# Patient Record
Sex: Female | Born: 2000 | Race: Black or African American | Hispanic: No | Marital: Single | Smoking: Never smoker
Health system: Southern US, Community
[De-identification: ages and names within clinical notes are randomized; demographics above are authoritative.]

## PROBLEM LIST (undated history)

## (undated) DIAGNOSIS — G43909 Migraine, unspecified, not intractable, without status migrainosus: Secondary | ICD-10-CM

## (undated) DIAGNOSIS — E282 Polycystic ovarian syndrome: Secondary | ICD-10-CM

## (undated) HISTORY — DX: Polycystic ovarian syndrome: E28.2

## (undated) HISTORY — DX: Migraine, unspecified, not intractable, without status migrainosus: G43.909

---
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
## 2019-04-30 DIAGNOSIS — Z114 Encounter for screening for human immunodeficiency virus [HIV]: Secondary | ICD-10-CM | POA: Diagnosis not present

## 2019-04-30 DIAGNOSIS — Z01419 Encounter for gynecological examination (general) (routine) without abnormal findings: Secondary | ICD-10-CM | POA: Diagnosis not present

## 2019-04-30 DIAGNOSIS — Z113 Encounter for screening for infections with a predominantly sexual mode of transmission: Secondary | ICD-10-CM | POA: Diagnosis not present

## 2019-04-30 DIAGNOSIS — Z118 Encounter for screening for other infectious and parasitic diseases: Secondary | ICD-10-CM | POA: Diagnosis not present

## 2019-04-30 DIAGNOSIS — N914 Secondary oligomenorrhea: Secondary | ICD-10-CM | POA: Diagnosis not present

## 2019-04-30 DIAGNOSIS — Z1159 Encounter for screening for other viral diseases: Secondary | ICD-10-CM | POA: Diagnosis not present

## 2019-04-30 DIAGNOSIS — Z6838 Body mass index (BMI) 38.0-38.9, adult: Secondary | ICD-10-CM | POA: Diagnosis not present

## 2019-07-31 ENCOUNTER — Other Ambulatory Visit: Payer: Self-pay

## 2019-07-31 DIAGNOSIS — Z20828 Contact with and (suspected) exposure to other viral communicable diseases: Secondary | ICD-10-CM | POA: Diagnosis not present

## 2019-07-31 DIAGNOSIS — Z20822 Contact with and (suspected) exposure to covid-19: Secondary | ICD-10-CM

## 2019-08-02 LAB — NOVEL CORONAVIRUS, NAA: SARS-CoV-2, NAA: NOT DETECTED

## 2019-08-24 DIAGNOSIS — N914 Secondary oligomenorrhea: Secondary | ICD-10-CM | POA: Diagnosis not present

## 2019-09-18 ENCOUNTER — Other Ambulatory Visit: Payer: Self-pay

## 2019-09-18 DIAGNOSIS — Z20822 Contact with and (suspected) exposure to covid-19: Secondary | ICD-10-CM

## 2019-09-20 LAB — NOVEL CORONAVIRUS, NAA: SARS-CoV-2, NAA: NOT DETECTED

## 2019-10-28 ENCOUNTER — Ambulatory Visit: Payer: BC Managed Care – PPO | Attending: Internal Medicine

## 2019-10-28 DIAGNOSIS — Z20822 Contact with and (suspected) exposure to covid-19: Secondary | ICD-10-CM

## 2019-10-29 LAB — NOVEL CORONAVIRUS, NAA: SARS-CoV-2, NAA: NOT DETECTED

## 2019-12-22 DIAGNOSIS — Z1159 Encounter for screening for other viral diseases: Secondary | ICD-10-CM | POA: Diagnosis not present

## 2019-12-22 DIAGNOSIS — Z118 Encounter for screening for other infectious and parasitic diseases: Secondary | ICD-10-CM | POA: Diagnosis not present

## 2019-12-22 DIAGNOSIS — Z114 Encounter for screening for human immunodeficiency virus [HIV]: Secondary | ICD-10-CM | POA: Diagnosis not present

## 2019-12-22 DIAGNOSIS — Z113 Encounter for screening for infections with a predominantly sexual mode of transmission: Secondary | ICD-10-CM | POA: Diagnosis not present

## 2019-12-22 DIAGNOSIS — B373 Candidiasis of vulva and vagina: Secondary | ICD-10-CM | POA: Diagnosis not present

## 2020-01-25 DIAGNOSIS — Z113 Encounter for screening for infections with a predominantly sexual mode of transmission: Secondary | ICD-10-CM | POA: Diagnosis not present

## 2020-01-25 DIAGNOSIS — Z1159 Encounter for screening for other viral diseases: Secondary | ICD-10-CM | POA: Diagnosis not present

## 2020-01-25 DIAGNOSIS — Z118 Encounter for screening for other infectious and parasitic diseases: Secondary | ICD-10-CM | POA: Diagnosis not present

## 2020-01-25 DIAGNOSIS — N760 Acute vaginitis: Secondary | ICD-10-CM | POA: Diagnosis not present

## 2020-01-25 DIAGNOSIS — Z114 Encounter for screening for human immunodeficiency virus [HIV]: Secondary | ICD-10-CM | POA: Diagnosis not present

## 2020-01-25 DIAGNOSIS — L292 Pruritus vulvae: Secondary | ICD-10-CM | POA: Diagnosis not present

## 2020-03-01 ENCOUNTER — Other Ambulatory Visit: Payer: Self-pay

## 2020-03-01 ENCOUNTER — Ambulatory Visit: Payer: BC Managed Care – PPO | Attending: Internal Medicine

## 2020-03-01 DIAGNOSIS — Z20822 Contact with and (suspected) exposure to covid-19: Secondary | ICD-10-CM

## 2020-03-02 LAB — NOVEL CORONAVIRUS, NAA: SARS-CoV-2, NAA: NOT DETECTED

## 2020-03-02 LAB — SARS-COV-2, NAA 2 DAY TAT

## 2020-05-24 DIAGNOSIS — Z113 Encounter for screening for infections with a predominantly sexual mode of transmission: Secondary | ICD-10-CM | POA: Diagnosis not present

## 2020-05-24 DIAGNOSIS — Z01419 Encounter for gynecological examination (general) (routine) without abnormal findings: Secondary | ICD-10-CM | POA: Diagnosis not present

## 2020-05-24 DIAGNOSIS — Z6838 Body mass index (BMI) 38.0-38.9, adult: Secondary | ICD-10-CM | POA: Diagnosis not present

## 2020-10-03 DIAGNOSIS — F4323 Adjustment disorder with mixed anxiety and depressed mood: Secondary | ICD-10-CM | POA: Diagnosis not present

## 2020-10-05 DIAGNOSIS — Z113 Encounter for screening for infections with a predominantly sexual mode of transmission: Secondary | ICD-10-CM | POA: Diagnosis not present

## 2020-10-05 DIAGNOSIS — Z01419 Encounter for gynecological examination (general) (routine) without abnormal findings: Secondary | ICD-10-CM | POA: Diagnosis not present

## 2020-10-05 DIAGNOSIS — E282 Polycystic ovarian syndrome: Secondary | ICD-10-CM | POA: Diagnosis not present

## 2020-10-12 DIAGNOSIS — U071 COVID-19: Secondary | ICD-10-CM | POA: Diagnosis not present

## 2020-10-13 DIAGNOSIS — F4323 Adjustment disorder with mixed anxiety and depressed mood: Secondary | ICD-10-CM | POA: Diagnosis not present

## 2021-05-03 ENCOUNTER — Other Ambulatory Visit: Payer: BC Managed Care – PPO

## 2021-05-04 ENCOUNTER — Ambulatory Visit: Payer: BC Managed Care – PPO | Attending: Internal Medicine

## 2021-05-04 DIAGNOSIS — Z20822 Contact with and (suspected) exposure to covid-19: Secondary | ICD-10-CM

## 2021-05-05 LAB — NOVEL CORONAVIRUS, NAA: SARS-CoV-2, NAA: NOT DETECTED

## 2021-05-05 LAB — SARS-COV-2, NAA 2 DAY TAT

## 2021-05-16 ENCOUNTER — Encounter (HOSPITAL_COMMUNITY): Payer: Self-pay | Admitting: Emergency Medicine

## 2021-05-16 ENCOUNTER — Ambulatory Visit (HOSPITAL_COMMUNITY)
Admission: EM | Admit: 2021-05-16 | Discharge: 2021-05-16 | Disposition: A | Discharge status: Home / Self Care | Payer: 59 | Attending: Emergency Medicine | Admitting: Emergency Medicine

## 2021-05-16 DIAGNOSIS — R0789 Other chest pain: Secondary | ICD-10-CM

## 2021-05-16 DIAGNOSIS — K219 Gastro-esophageal reflux disease without esophagitis: Secondary | ICD-10-CM

## 2021-05-16 DIAGNOSIS — F419 Anxiety disorder, unspecified: Secondary | ICD-10-CM

## 2021-05-16 MED ORDER — PANTOPRAZOLE SODIUM 20 MG PO TBEC: 20.0000 mg | DELAYED_RELEASE_TABLET | Freq: Every day | ORAL | 0 refills | Status: DC

## 2021-05-16 NOTE — Discharge Instructions (Addendum)
Take the Protonix daily to help with acid reflux and chest pain.  Make sure you are drinking plenty of fluids especially water.  Try to avoid spicy or high acidity foods.  Do not lay down immediately after eating.  If you feel stressed or anxious, try relaxation or deep breathing exercises.    Follow up with your primary care provider as scheduled.   Return or go to the Emergency Department if symptoms worsen or do not improve in the next few days.

## 2021-05-16 NOTE — ED Provider Notes (Signed)
[facility]    CSN: [id number] Arrival date & time: 05/16/21  1026      History   Chief Complaint Chief Complaint  Patient presents with   Chest Pain   Headache   Back Pain    HPI Andrea Scott is a 20 y.o. female.   Patient here for evaluation of chest pain/pressure, upper back pain, and intermittent arm pain that has been ongoing for the past 1-2 weeks.  Reports chest pain is worse at night when laying down.  Denies any abdominal pain, nausea, vomiting, or shortness of breath.  Has not tried any OTC medication or treatment.  Does report eating late at night.  Denies any trauma, injury, or other precipitating event.  Denies any specific alleviating or aggravating factors.  Denies any fevers, shortness of breath, numbness, tingling, weakness, abdominal pain, or headaches.    The history is provided by the patient.  Chest Pain Associated symptoms: back pain and headache   Associated symptoms: no shortness of breath   Headache Associated symptoms: back pain   Back Pain Associated symptoms: chest pain and headaches    History reviewed. No pertinent past medical history.  Patient Active Problem List   Diagnosis Date Noted   Non-obstetric vaginal laceration without foreign body or perineal laceration     Past Surgical History:  Procedure Laterality Date   BREAST REDUCTION SURGERY  2019   PERINEAL LACERATION REPAIR N/A 03/02/2018   Procedure: SUTURE REPAIR VAGINAL LACERATION;  Surgeon: Lazaro Arms, MD;  Location: [facility];  Service: Gynecology;  Laterality: N/A;  repair of vaginal laceration   VULVECTOMY PARTIAL Bilateral 10/05/2015   Procedure: VULVECTOMY PARTIAL;  Surgeon: Maxie Better, MD;  Location: [facility];  Service: Gynecology;  Laterality: Bilateral;  45 minutes    OB History   No obstetric history on file.      Home Medications    Prior to Admission medications   Medication Sig Start Date End Date Taking? Authorizing Provider  pantoprazole  (PROTONIX) 20 MG tablet Take 1 tablet (20 mg total) by mouth daily. 05/16/21  Yes Ivette Loyal, NP  cetirizine (ZYRTEC) 5 MG tablet Take 5 mg by mouth daily.    [provider]  HYDROcodone-acetaminophen (NORCO/VICODIN) 5-325 MG tablet Take 1 tablet by mouth every 6 (six) hours as needed. 03/02/18   Lazaro Arms, MD  ibuprofen (ADVIL,MOTRIN) 800 MG tablet Take 1 tablet (800 mg total) by mouth every 8 (eight) hours as needed. 03/02/18   Lazaro Arms, MD  OVER THE COUNTER MEDICATION Take 1 tablet by mouth daily. Patient takes over the counter Vitamin D    [provider]    Family History History reviewed. No pertinent family history.  Social History Social History   Tobacco Use   Smoking status: Never   Smokeless tobacco: Never  Substance Use Topics   Alcohol use: No   Drug use: No     Allergies   Patient has no known allergies.   Review of Systems Review of Systems  Respiratory:  Negative for shortness of breath.   Cardiovascular:  Positive for chest pain.  Musculoskeletal:  Positive for back pain.  Neurological:  Positive for headaches.  All other systems reviewed and are negative.   Physical Exam Triage Vital Signs ED Triage Vitals  Enc Vitals Group     BP 05/16/21 1126 131/80     Pulse Rate 05/16/21 1126 86     Resp 05/16/21 1126 18  Temp 05/16/21 1126 99 F (37.2 C)     Temp Source 05/16/21 1126 Oral     SpO2 05/16/21 1126 99 %     Weight --      Height --      Head Circumference --      Peak Flow --      Pain Score 05/16/21 1124 5     Pain Loc --      Pain Edu? --      Excl. in GC? --    No data found.  Updated Vital Signs BP 131/80   Pulse 86   Temp 99 F (37.2 C) (Oral)   Resp 18   SpO2 99%   Visual Acuity Right Eye Distance:   Left Eye Distance:   Bilateral Distance:    Right Eye Near:   Left Eye Near:    Bilateral Near:     Physical Exam Vitals and nursing note reviewed.  Constitutional:      General: She is  not in acute distress.    Appearance: Normal appearance. She is not ill-appearing, toxic-appearing or diaphoretic.  HENT:     Head: Normocephalic and atraumatic.  Eyes:     Extraocular Movements: Extraocular movements intact.     Conjunctiva/sclera: Conjunctivae normal.     Pupils: Pupils are equal, round, and reactive to light.  Cardiovascular:     Rate and Rhythm: Normal rate and regular rhythm.     Pulses: Normal pulses.          Radial pulses are 2+ on the right side and 2+ on the left side.     Heart sounds: Normal heart sounds.  Pulmonary:     Effort: Pulmonary effort is normal.     Breath sounds: Normal breath sounds.  Chest:     Chest wall: No mass, deformity, tenderness, crepitus or edema. There is no dullness to percussion.  Abdominal:     General: Abdomen is flat.     Palpations: Abdomen is soft.  Musculoskeletal:        General: Normal range of motion.     Cervical back: Normal and normal range of motion. No tenderness or bony tenderness.     Thoracic back: Normal. No tenderness or bony tenderness.     Lumbar back: Normal.  Skin:    General: Skin is warm and dry.  Neurological:     General: No focal deficit present.     Mental Status: She is alert and oriented to person, place, and time.  Psychiatric:        Mood and Affect: Mood normal.     UC Treatments / Results  Labs (all labs ordered are listed, but only abnormal results are displayed) Labs Reviewed - No data to display  EKG   Radiology No results found.  Procedures Procedures (including critical care time)  Medications Ordered in UC Medications - No data to display  Initial Impression / Assessment and Plan / UC Course  I have reviewed the triage vital signs and the nursing notes.  Pertinent labs & imaging results that were available during my care of the patient were reviewed by me and considered in my medical decision making (see chart for details).    Assessment negative for red flags or  concerns.  Chest wall pain.  Possible GERD vs anxiety.  Patient does reports history of GERD and mentions being under more stress than normal.  EKG normal sinus rhythm with normal rate.  Will treat with protonix  daily.  Encouraged fluids and avoid spicy or high acidity foods.  Recommend relaxation or deep breathing exercises for anxiety.  Follow up with primary care as scheduled.  Strict ED follow up for any red flag symptoms.   Final Clinical Impressions(s) / UC Diagnoses   Final diagnoses:  Chest wall pain  Gastroesophageal reflux disease without esophagitis  Anxiety     Discharge Instructions      Take the Protonix daily to help with acid reflux and chest pain.  Make sure you are drinking plenty of fluids especially water.  Try to avoid spicy or high acidity foods.  Do not lay down immediately after eating.  If you feel stressed or anxious, try relaxation or deep breathing exercises.    Follow up with your primary care provider as scheduled.   Return or go to the Emergency Department if symptoms worsen or do not improve in the next few days.      ED Prescriptions     Medication Sig Dispense Auth. Provider   pantoprazole (PROTONIX) 20 MG tablet Take 1 tablet (20 mg total) by mouth daily. 30 tablet Ivette Loyal, NP      PDMP not reviewed this encounter.   Ivette Loyal, NP 05/16/21 1300

## 2021-05-16 NOTE — ED Triage Notes (Signed)
Pt is present today with left arm pain, HA, left sided chest pressure, and upper back pain. Pt states that her sx started x1 week ago.

## 2021-07-16 ENCOUNTER — Encounter (HOSPITAL_COMMUNITY): Payer: Self-pay | Admitting: Emergency Medicine

## 2021-07-16 ENCOUNTER — Other Ambulatory Visit: Payer: Self-pay

## 2021-07-16 ENCOUNTER — Ambulatory Visit (HOSPITAL_COMMUNITY)
Admission: EM | Admit: 2021-07-16 | Discharge: 2021-07-16 | Disposition: A | Discharge status: Home / Self Care | Payer: 59 | Attending: Physician Assistant | Admitting: Physician Assistant

## 2021-07-16 DIAGNOSIS — R8290 Unspecified abnormal findings in urine: Secondary | ICD-10-CM | POA: Diagnosis present

## 2021-07-16 DIAGNOSIS — N898 Other specified noninflammatory disorders of vagina: Secondary | ICD-10-CM | POA: Diagnosis present

## 2021-07-16 LAB — POCT URINALYSIS DIPSTICK, ED / UC
Bilirubin Urine: NEGATIVE
Glucose, UA: NEGATIVE mg/dL
Hgb urine dipstick: NEGATIVE
Ketones, ur: NEGATIVE mg/dL
Leukocytes,Ua: NEGATIVE
Nitrite: NEGATIVE
Protein, ur: NEGATIVE mg/dL
Specific Gravity, Urine: 1.025 (ref 1.005–1.030)
Urobilinogen, UA: 0.2 mg/dL (ref 0.0–1.0)
pH: 6 (ref 5.0–8.0)

## 2021-07-16 NOTE — Discharge Instructions (Addendum)
Check MyChart for results. We will call and treat any positive test. Follow up with any further concerns.

## 2021-07-16 NOTE — ED Provider Notes (Signed)
[facility]    CSN: [id number] Arrival date & time: 07/16/21  1509      History   Chief Complaint Chief Complaint  Patient presents with   Vaginal Itching   Vaginal Discharge    HPI Andrea Scott is a 20 y.o. female.   Patient here today for evaluation of foul-smelling urine, and vaginal discharge that she has noted for the last 5 days.  She reports that she has not had any dysuria, and denies any urinary frequency.  She denies risk of pregnancy or STDs as she was checked for this in August and has not been sexually active since that time.  She denies any abdominal pain, nausea, vomiting.  She has not had any back pain that is unusual for her.  She does not report any treatment for symptoms.  The history is provided by the patient.  Vaginal Itching Pertinent negatives include no abdominal pain and no shortness of breath.  Vaginal Discharge Associated symptoms: vaginal itching   Associated symptoms: no abdominal pain, no dysuria, no fever, no nausea and no vomiting    History reviewed. No pertinent past medical history.  Patient Active Problem List   Diagnosis Date Noted   Non-obstetric vaginal laceration without foreign body or perineal laceration     Past Surgical History:  Procedure Laterality Date   BREAST REDUCTION SURGERY  2019   PERINEAL LACERATION REPAIR N/A 03/02/2018   Procedure: SUTURE REPAIR VAGINAL LACERATION;  Surgeon: Lazaro Arms, MD;  Location: [facility];  Service: Gynecology;  Laterality: N/A;  repair of vaginal laceration   VULVECTOMY PARTIAL Bilateral 10/05/2015   Procedure: VULVECTOMY PARTIAL;  Surgeon: Maxie Better, MD;  Location: [facility];  Service: Gynecology;  Laterality: Bilateral;  45 minutes    OB History   No obstetric history on file.      Home Medications    Prior to Admission medications   Medication Sig Start Date End Date Taking? Authorizing Provider  cetirizine (ZYRTEC) 5 MG tablet Take 5 mg by mouth daily.     [provider]  HYDROcodone-acetaminophen (NORCO/VICODIN) 5-325 MG tablet Take 1 tablet by mouth every 6 (six) hours as needed. 03/02/18   Lazaro Arms, MD  ibuprofen (ADVIL,MOTRIN) 800 MG tablet Take 1 tablet (800 mg total) by mouth every 8 (eight) hours as needed. 03/02/18   Lazaro Arms, MD  OVER THE COUNTER MEDICATION Take 1 tablet by mouth daily. Patient takes over the counter Vitamin D    [provider]  pantoprazole (PROTONIX) 20 MG tablet Take 1 tablet (20 mg total) by mouth daily. 05/16/21   Ivette Loyal, NP    Family History History reviewed. No pertinent family history.  Social History Social History   Tobacco Use   Smoking status: Never   Smokeless tobacco: Never  Substance Use Topics   Alcohol use: No   Drug use: No     Allergies   Patient has no known allergies.   Review of Systems Review of Systems  Constitutional:  Negative for chills and fever.  Respiratory:  Negative for shortness of breath.   Gastrointestinal:  Negative for abdominal pain, nausea and vomiting.  Genitourinary:  Positive for vaginal discharge. Negative for dysuria and frequency.  Musculoskeletal:  Negative for back pain.    Physical Exam Triage Vital Signs ED Triage Vitals  Enc Vitals Group     BP 07/16/21 1600 117/79     Pulse Rate 07/16/21 1600 98     Resp  07/16/21 1600 16     Temp 07/16/21 1600 98.7 F (37.1 C)     Temp Source 07/16/21 1600 Oral     SpO2 07/16/21 1600 97 %     Weight --      Height --      Head Circumference --      Peak Flow --      Pain Score 07/16/21 1559 0     Pain Loc --      Pain Edu? --      Excl. in GC? --    No data found.  Updated Vital Signs BP 117/79 (BP Location: Right Arm)   Pulse 98   Temp 98.7 F (37.1 C) (Oral)   Resp 16   SpO2 97%      Physical Exam Vitals and nursing note reviewed.  Constitutional:      General: She is not in acute distress.    Appearance: Normal appearance. She is not ill-appearing.   HENT:     Head: Normocephalic and atraumatic.     Nose: Nose normal.  Eyes:     Conjunctiva/sclera: Conjunctivae normal.  Cardiovascular:     Rate and Rhythm: Normal rate and regular rhythm.     Heart sounds: Normal heart sounds. No murmur heard. Pulmonary:     Effort: Pulmonary effort is normal. No respiratory distress.     Breath sounds: Normal breath sounds. No wheezing, rhonchi or rales.  Abdominal:     General: Abdomen is flat. Bowel sounds are normal. There is no distension.     Palpations: Abdomen is soft.     Tenderness: There is no abdominal tenderness. There is no right CVA tenderness, left CVA tenderness or guarding.  Skin:    General: Skin is warm and dry.  Neurological:     Mental Status: She is alert.  Psychiatric:        Mood and Affect: Mood normal.        Thought Content: Thought content normal.     UC Treatments / Results  Labs (all labs ordered are listed, but only abnormal results are displayed) Labs Reviewed  URINE CULTURE  POCT URINALYSIS DIPSTICK, ED / UC  CERVICOVAGINAL ANCILLARY ONLY    EKG   Radiology No results found.  Procedures Procedures (including critical care time)  Medications Ordered in UC Medications - No data to display  Initial Impression / Assessment and Plan / UC Course  I have reviewed the triage vital signs and the nursing notes.  Pertinent labs & imaging results that were available during my care of the patient were reviewed by me and considered in my medical decision making (see chart for details).    UA without findings consistent with UTI.  Will order urine culture.  Cytology swab collected for further evaluation of vaginal discharge and to rule out BV as cause of symptoms.  Will await results for further recommendation.  Final Clinical Impressions(s) / UC Diagnoses   Final diagnoses:  Vaginal discharge  Foul smelling urine     Discharge Instructions      Check MyChart for results. We will call and treat  any positive test. Follow up with any further concerns.      ED Prescriptions   None    PDMP not reviewed this encounter.   Tomi Bamberger, PA-C 07/16/21 1642

## 2021-07-16 NOTE — ED Triage Notes (Signed)
Pt presents with urine odor, vaginal discharge, and vaginal itching xs 5 days.

## 2021-07-17 LAB — CERVICOVAGINAL ANCILLARY ONLY
Bacterial Vaginitis (gardnerella): NEGATIVE
Candida Glabrata: NEGATIVE
Candida Vaginitis: NEGATIVE
Chlamydia: NEGATIVE
Comment: NEGATIVE
Comment: NEGATIVE
Comment: NEGATIVE
Comment: NEGATIVE
Comment: NEGATIVE
Comment: NORMAL
Neisseria Gonorrhea: NEGATIVE
Trichomonas: NEGATIVE

## 2021-07-17 LAB — URINE CULTURE: Culture: NO GROWTH

## 2022-04-12 ENCOUNTER — Ambulatory Visit (INDEPENDENT_AMBULATORY_CARE_PROVIDER_SITE_OTHER): Payer: Self-pay | Admitting: Podiatry

## 2022-04-12 ENCOUNTER — Ambulatory Visit: Payer: Self-pay

## 2022-04-12 DIAGNOSIS — M79671 Pain in right foot: Secondary | ICD-10-CM

## 2022-04-12 DIAGNOSIS — M7751 Other enthesopathy of right foot: Secondary | ICD-10-CM

## 2022-04-12 DIAGNOSIS — M722 Plantar fascial fibromatosis: Secondary | ICD-10-CM

## 2022-04-12 MED ORDER — METHYLPREDNISOLONE 4 MG PO TBPK: ORAL_TABLET | ORAL | 0 refills | Status: DC

## 2022-04-12 MED ORDER — MELOXICAM 7.5 MG PO TABS: 7.5000 mg | ORAL_TABLET | Freq: Every day | ORAL | 0 refills | Status: DC | PRN

## 2022-04-12 NOTE — Patient Instructions (Addendum)
Start with the medrol dose pack and once complete you can start mobic  For inserts I  like powersepts, superfeet, aetrex  Achilles Tendinitis  with Rehab Achilles tendinitis is a disorder of the Achilles tendon. The Achilles tendon connects the large calf muscles (Gastrocnemius and Soleus) to the heel bone (calcaneus). This tendon is sometimes called the heel cord. It is important for pushing-off and standing on your toes and is important for walking, running, or jumping. Tendinitis is often caused by overuse and repetitive microtrauma. SYMPTOMS Pain, tenderness, swelling, warmth, and redness may occur over the Achilles tendon even at rest. Pain with pushing off, or flexing or extending the ankle. Pain that is worsened after or during activity. CAUSES  Overuse sometimes seen with rapid increase in exercise programs or in sports requiring running and jumping. Poor physical conditioning (strength and flexibility or endurance). Running sports, especially training running down hills. Inadequate warm-up before practice or play or failure to stretch before participation. Injury to the tendon. PREVENTION  Warm up and stretch before practice or competition. Allow time for adequate rest and recovery between practices and competition. Keep up conditioning. Keep up ankle and leg flexibility. Improve or keep muscle strength and endurance. Improve cardiovascular fitness. Use proper technique. Use proper equipment (shoes, skates). To help prevent recurrence, taping, protective strapping, or an adhesive bandage may be recommended for several weeks after healing is complete. PROGNOSIS  Recovery may take weeks to several months to heal. Longer recovery is expected if symptoms have been prolonged. Recovery is usually quicker if the inflammation is due to a direct blow as compared with overuse or sudden strain. RELATED COMPLICATIONS  Healing time will be prolonged if the condition is not correctly  treated. The injury must be given plenty of time to heal. Symptoms can reoccur if activity is resumed too soon. Untreated, tendinitis may increase the risk of tendon rupture requiring additional time for recovery and possibly surgery. TREATMENT  The first treatment consists of rest anti-inflammatory medication, and ice to relieve the pain. Stretching and strengthening exercises after resolution of pain will likely help reduce the risk of recurrence. Referral to a physical therapist or athletic trainer for further evaluation and treatment may be helpful. A walking boot or cast may be recommended to rest the Achilles tendon. This can help break the cycle of inflammation and microtrauma. Arch supports (orthotics) may be prescribed or recommended by your caregiver as an adjunct to therapy and rest. Surgery to remove the inflamed tendon lining or degenerated tendon tissue is rarely necessary and has shown less than predictable results. MEDICATION  Nonsteroidal anti-inflammatory medications, such as aspirin and ibuprofen, may be used for pain and inflammation relief. Do not take within 7 days before surgery. Take these as directed by your caregiver. Contact your caregiver immediately if any bleeding, stomach upset, or signs of allergic reaction occur. Other minor pain relievers, such as acetaminophen, may also be used. Pain relievers may be prescribed as necessary by your caregiver. Do not take prescription pain medication for longer than 4 to 7 days. Use only as directed and only as much as you need. Cortisone injections are rarely indicated. Cortisone injections may weaken tendons and predispose to rupture. It is better to give the condition more time to heal than to use them. HEAT AND COLD Cold is used to relieve pain and reduce inflammation for acute and chronic Achilles tendinitis. Cold should be applied for 10 to 15 minutes every 2 to 3 hours for inflammation and pain  and immediately after any activity  that aggravates your symptoms. Use ice packs or an ice massage. Heat may be used before performing stretching and strengthening activities prescribed by your caregiver. Use a heat pack or a warm soak. SEEK MEDICAL CARE IF: Symptoms get worse or do not improve in 2 weeks despite treatment. New, unexplained symptoms develop. Drugs used in treatment may produce side effects.  EXERCISES:  RANGE OF MOTION (ROM) AND STRETCHING EXERCISES - Achilles Tendinitis  These exercises may help you when beginning to rehabilitate your injury. Your symptoms may resolve with or without further involvement from your physician, physical therapist or athletic trainer. While completing these exercises, remember:  Restoring tissue flexibility helps normal motion to return to the joints. This allows healthier, less painful movement and activity. An effective stretch should be held for at least 30 seconds. A stretch should never be painful. You should only feel a gentle lengthening or release in the stretched tissue.  STRETCH  Gastroc, Standing  Place hands on wall. Extend right / left leg, keeping the front knee somewhat bent. Slightly point your toes inward on your back foot. Keeping your right / left heel on the floor and your knee straight, shift your weight toward the wall, not allowing your back to arch. You should feel a gentle stretch in the right / left calf. Hold this position for 10 seconds. Repeat 3 times. Complete this stretch 2 times per day.  STRETCH  Soleus, Standing  Place hands on wall. Extend right / left leg, keeping the other knee somewhat bent. Slightly point your toes inward on your back foot. Keep your right / left heel on the floor, bend your back knee, and slightly shift your weight over the back leg so that you feel a gentle stretch deep in your back calf. Hold this position for 10 seconds. Repeat 3 times. Complete this stretch 2 times per day.  STRETCH  Gastrocsoleus, Standing  Note:  This exercise can place a lot of stress on your foot and ankle. Please complete this exercise only if specifically instructed by your caregiver.  Place the ball of your right / left foot on a step, keeping your other foot firmly on the same step. Hold on to the wall or a rail for balance. Slowly lift your other foot, allowing your body weight to press your heel down over the edge of the step. You should feel a stretch in your right / left calf. Hold this position for 10 seconds. Repeat this exercise with a slight bend in your knee. Repeat 3 times. Complete this stretch 2 times per day.   STRENGTHENING EXERCISES - Achilles Tendinitis These exercises may help you when beginning to rehabilitate your injury. They may resolve your symptoms with or without further involvement from your physician, physical therapist or athletic trainer. While completing these exercises, remember:  Muscles can gain both the endurance and the strength needed for everyday activities through controlled exercises. Complete these exercises as instructed by your physician, physical therapist or athletic trainer. Progress the resistance and repetitions only as guided. You may experience muscle soreness or fatigue, but the pain or discomfort you are trying to eliminate should never worsen during these exercises. If this pain does worsen, stop and make certain you are following the directions exactly. If the pain is still present after adjustments, discontinue the exercise until you can discuss the trouble with your clinician.  STRENGTH - Plantar-flexors  Sit with your right / left leg extended. Holding onto  both ends of a rubber exercise band/tubing, loop it around the ball of your foot. Keep a slight tension in the band. Slowly push your toes away from you, pointing them downward. Hold this position for 10 seconds. Return slowly, controlling the tension in the band/tubing. Repeat 3 times. Complete this exercise 2 times per day.    STRENGTH - Plantar-flexors  Stand with your feet shoulder width apart. Steady yourself with a wall or table using as little support as needed. Keeping your weight evenly spread over the width of your feet, rise up on your toes.* Hold this position for 10 seconds. Repeat 3 times. Complete this exercise 2 times per day.  *If this is too easy, shift your weight toward your right / left leg until you feel challenged. Ultimately, you may be asked to do this exercise with your right / left foot only.  STRENGTH  Plantar-flexors, Eccentric  Note: This exercise can place a lot of stress on your foot and ankle. Please complete this exercise only if specifically instructed by your caregiver.  Place the balls of your feet on a step. With your hands, use only enough support from a wall or rail to keep your balance. Keep your knees straight and rise up on your toes. Slowly shift your weight entirely to your right / left toes and pick up your opposite foot. Gently and with controlled movement, lower your weight through your right / left foot so that your heel drops below the level of the step. You will feel a slight stretch in the back of your calf at the end position. Use the healthy leg to help rise up onto the balls of both feet, then lower weight only on the right / left leg again. Build up to 15 repetitions. Then progress to 3 consecutive sets of 15 repetitions.* After completing the above exercise, complete the same exercise with a slight knee bend (about 30 degrees). Again, build up to 15 repetitions. Then progress to 3 consecutive sets of 15 repetitions.* Perform this exercise 2 times per day.  *When you easily complete 3 sets of 15, your physician, physical therapist or athletic trainer may advise you to add resistance by wearing a backpack filled with additional weight.  STRENGTH - Plantar Flexors, Seated  Sit on a chair that allows your feet to rest flat on the ground. If necessary, sit at the edge  of the chair. Keeping your toes firmly on the ground, lift your right / left heel as far as you can without increasing any discomfort in your ankle. Repeat 3 times. Complete this exercise 2 times a day.

## 2022-04-15 NOTE — Progress Notes (Signed)
Subjective:   Patient ID: Andrea Scott, female   DOB: 21 y.o.   MRN: [id number]   HPI 21 year old female presents the office today with concerns of right foot pain.  Started about a month ago.  No injuries that she reports currently.  She states that last October she states that she felt a pop in her ankle but the pain that eventually went away.  She states that at the time there is no bruising or swelling.  She feels tightness and throbbing sensation.  She has tried elevating which helps and she tried a compression sock which makes it hurt more.   Review of Systems  All other systems reviewed and are negative.  No past medical history on file.  Past Surgical History:  Procedure Laterality Date   BREAST REDUCTION SURGERY  2019   PERINEAL LACERATION REPAIR N/A 03/02/2018   Procedure: SUTURE REPAIR VAGINAL LACERATION;  Surgeon: Lazaro Arms, MD;  Location: [facility];  Service: Gynecology;  Laterality: N/A;  repair of vaginal laceration   VULVECTOMY PARTIAL Bilateral 10/05/2015   Procedure: VULVECTOMY PARTIAL;  Surgeon: Maxie Better, MD;  Location: [facility];  Service: Gynecology;  Laterality: Bilateral;  45 minutes     Current Outpatient Medications:    meloxicam (MOBIC) 7.5 MG tablet, Take 1 tablet (7.5 mg total) by mouth daily as needed for pain., Disp: 30 tablet, Rfl: 0   methylPREDNISolone (MEDROL DOSEPAK) 4 MG TBPK tablet, Take as directed, Disp: 21 tablet, Rfl: 0   cetirizine (ZYRTEC) 5 MG tablet, Take 5 mg by mouth daily., Disp: , Rfl:    HYDROcodone-acetaminophen (NORCO/VICODIN) 5-325 MG tablet, Take 1 tablet by mouth every 6 (six) hours as needed., Disp: 15 tablet, Rfl: 0   ibuprofen (ADVIL,MOTRIN) 800 MG tablet, Take 1 tablet (800 mg total) by mouth every 8 (eight) hours as needed., Disp: 30 tablet, Rfl: 0   OVER THE COUNTER MEDICATION, Take 1 tablet by mouth daily. Patient takes over the counter Vitamin D, Disp: , Rfl:    pantoprazole (PROTONIX) 20 MG tablet, Take 1 tablet  (20 mg total) by mouth daily., Disp: 30 tablet, Rfl: 0  No Known Allergies         Objective:  Physical Exam  General: AAO x3, NAD  Dermatological: Skin is warm, dry and supple bilateral.  There are no open sores, no preulcerative lesions, no rash or signs of infection present.  Vascular: Dorsalis Pedis artery and Posterior Tibial artery pedal pulses are 2/4 bilateral with immedate capillary fill time. There is no pain with calf compression, swelling, warmth, erythema.   Neruologic: Grossly intact via light touch bilateral.  Negative Tinel sign.  Musculoskeletal: There is mild tenderness palpation along the occipital medial band plantar fascia.  There is modest, along the medial aspect of the foot towards the ankle and the course of the posterior tibial, flexor tendon.  Clinically tender appears to be intact.  MMT 5/5.  Flexor, extensor tendons clinically appear to be intact.  Equinus is present.  Gait: Unassisted, Nonantalgic.       Assessment:   Plantar fasciitis, tendinitis right side    Plan:  -Treatment options discussed including all alternatives, risks, and complications -Etiology of symptoms were discussed -X-rays were obtained and reviewed with the patient.  3 views of the right foot and ankle were obtained.  No evidence of acute fracture. -Prescribed Medrol Dosepak.  Once complete insert meloxicam. -Tri-Lock ankle brace was dispensed to help in stability and immobilization to facilitate soft tissue healing. -  Discussed traction, icing daily.  We discussed shoe modifications and inserts.  Insert over-the-counter inserts I discussed various over-the-counter inserts to look at.   Vivi Barrack DPM

## 2022-07-12 ENCOUNTER — Other Ambulatory Visit: Payer: Self-pay

## 2022-07-12 ENCOUNTER — Emergency Department (HOSPITAL_COMMUNITY): Payer: Self-pay

## 2022-07-12 ENCOUNTER — Emergency Department (HOSPITAL_COMMUNITY)
Admission: EM | Admit: 2022-07-12 | Discharge: 2022-07-13 | Discharge status: Against Medical Advice | Payer: Self-pay | Attending: Emergency Medicine | Admitting: Emergency Medicine

## 2022-07-12 ENCOUNTER — Encounter (HOSPITAL_COMMUNITY): Payer: Self-pay | Admitting: Emergency Medicine

## 2022-07-12 DIAGNOSIS — Z5321 Procedure and treatment not carried out due to patient leaving prior to being seen by health care provider: Secondary | ICD-10-CM | POA: Insufficient documentation

## 2022-07-12 DIAGNOSIS — G43909 Migraine, unspecified, not intractable, without status migrainosus: Secondary | ICD-10-CM | POA: Insufficient documentation

## 2022-07-12 DIAGNOSIS — R0789 Other chest pain: Secondary | ICD-10-CM | POA: Insufficient documentation

## 2022-07-12 DIAGNOSIS — R0602 Shortness of breath: Secondary | ICD-10-CM | POA: Insufficient documentation

## 2022-07-12 LAB — CBC WITH DIFFERENTIAL/PLATELET
Abs Immature Granulocytes: 0.01 10*3/uL (ref 0.00–0.07)
Basophils Absolute: 0 10*3/uL (ref 0.0–0.1)
Basophils Relative: 1 %
Eosinophils Absolute: 0.1 10*3/uL (ref 0.0–0.5)
Eosinophils Relative: 2 %
HCT: 38.4 % (ref 36.0–46.0)
Hemoglobin: 12.6 g/dL (ref 12.0–15.0)
Immature Granulocytes: 0 %
Lymphocytes Relative: 34 %
Lymphs Abs: 1.8 10*3/uL (ref 0.7–4.0)
MCH: 29 pg (ref 26.0–34.0)
MCHC: 32.8 g/dL (ref 30.0–36.0)
MCV: 88.5 fL (ref 80.0–100.0)
Monocytes Absolute: 0.5 10*3/uL (ref 0.1–1.0)
Monocytes Relative: 8 %
Neutro Abs: 2.9 10*3/uL (ref 1.7–7.7)
Neutrophils Relative %: 55 %
Platelets: 313 10*3/uL (ref 150–400)
RBC: 4.34 MIL/uL (ref 3.87–5.11)
RDW: 13 % (ref 11.5–15.5)
WBC: 5.4 10*3/uL (ref 4.0–10.5)
nRBC: 0 % (ref 0.0–0.2)

## 2022-07-12 NOTE — ED Provider Triage Note (Signed)
Emergency Medicine Provider Triage Evaluation Note  Andrea Scott , a 21 y.o. female  was evaluated in triage.  Pt complains of chest pain that started last week along with migraine headache.  Denies SOB.  No numbness/weakness.  No cough, fever, sick contacts.  Has been taking excedrin for headaches with improvement but none taken today.  Review of Systems  Positive: Chest pain, headache Negative: fever  Physical Exam  BP 117/71   Pulse 93   Temp 98.5 F (36.9 C) (Oral)   Resp 19   LMP 06/13/2022   SpO2 96%  Gen:   Awake, no distress   Resp:  Normal effort  MSK:   Moves extremities without difficulty  Other:  AAOx3, ambulatory, no focal deficits  Medical Decision Making  Medically screening exam initiated at 11:16 PM.  Appropriate orders placed.  Andrea Scott was informed that the remainder of the evaluation will be completed by another provider, this initial triage assessment does not replace that evaluation, and the importance of remaining in the ED until their evaluation is complete.  Chest pain, headache.  EKG, labs, CXR.   Andrea Pickett, PA-C 07/12/22 2317

## 2022-07-12 NOTE — ED Notes (Signed)
Called for triage, pt did not respond.

## 2022-07-12 NOTE — ED Triage Notes (Signed)
Patient reports intermittent central chest pressure onset last week with mild SOB , she adds migraine headache for 3 days . NO emesis or photophobia .

## 2022-07-13 LAB — BASIC METABOLIC PANEL
Anion gap: 10 (ref 5–15)
BUN: 10 mg/dL (ref 6–20)
CO2: 24 mmol/L (ref 22–32)
Calcium: 9 mg/dL (ref 8.9–10.3)
Chloride: 102 mmol/L (ref 98–111)
Creatinine, Ser: 1.01 mg/dL — ABNORMAL HIGH (ref 0.44–1.00)
GFR, Estimated: >60 mL/min (ref >60)
Glucose, Bld: 98 mg/dL (ref 70–99)
Potassium: 3.7 mmol/L (ref 3.5–5.1)
Sodium: 136 mmol/L (ref 135–145)

## 2022-07-13 LAB — I-STAT BETA HCG BLOOD, ED (MC, WL, AP ONLY): I-stat hCG, quantitative: <5 m[IU]/mL (ref <5)

## 2022-07-13 LAB — TROPONIN I (HIGH SENSITIVITY)
Troponin I (High Sensitivity): 4 ng/L (ref <18)
Troponin I (High Sensitivity): 4 ng/L (ref <18)

## 2022-07-13 NOTE — ED Notes (Signed)
Patient not responding to name being called for vitals rechek

## 2022-08-05 ENCOUNTER — Other Ambulatory Visit: Payer: Self-pay

## 2022-08-05 ENCOUNTER — Emergency Department (HOSPITAL_BASED_OUTPATIENT_CLINIC_OR_DEPARTMENT_OTHER): Payer: Commercial Managed Care - PPO | Admitting: Radiology

## 2022-08-05 ENCOUNTER — Emergency Department (HOSPITAL_BASED_OUTPATIENT_CLINIC_OR_DEPARTMENT_OTHER)
Admission: EM | Admit: 2022-08-05 | Discharge: 2022-08-05 | Disposition: A | Discharge status: Home / Self Care | Payer: Commercial Managed Care - PPO | Attending: Emergency Medicine | Admitting: Emergency Medicine

## 2022-08-05 ENCOUNTER — Encounter (HOSPITAL_BASED_OUTPATIENT_CLINIC_OR_DEPARTMENT_OTHER): Payer: Self-pay | Admitting: Emergency Medicine

## 2022-08-05 DIAGNOSIS — Y92410 Unspecified street and highway as the place of occurrence of the external cause: Secondary | ICD-10-CM | POA: Insufficient documentation

## 2022-08-05 DIAGNOSIS — M79671 Pain in right foot: Secondary | ICD-10-CM | POA: Diagnosis not present

## 2022-08-05 MED ORDER — METHOCARBAMOL 500 MG PO TABS: 500.0000 mg | ORAL_TABLET | Freq: Two times a day (BID) | ORAL | 0 refills | Status: DC

## 2022-08-05 MED ORDER — IBUPROFEN 800 MG PO TABS
800.0000 mg | ORAL_TABLET | Freq: Once | ORAL | Status: AC
  Administered 2022-08-05: 800 mg via ORAL
  Filled 2022-08-05: qty 1

## 2022-08-05 NOTE — ED Notes (Signed)
Ice offered, refused at this time
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
## 2022-08-05 NOTE — ED Notes (Signed)
Reviewed AVS/discharge instruction with patient. Time allotted for and all questions answered. Patient is agreeable for d/c and escorted to ed exit by staff.  

## 2023-01-13 ENCOUNTER — Encounter (HOSPITAL_COMMUNITY): Payer: Self-pay

## 2023-01-13 ENCOUNTER — Ambulatory Visit (HOSPITAL_COMMUNITY)
Admission: EM | Admit: 2023-01-13 | Discharge: 2023-01-13 | Disposition: A | Discharge status: Home / Self Care | Payer: Commercial Managed Care - PPO | Attending: Internal Medicine | Admitting: Internal Medicine

## 2023-01-13 DIAGNOSIS — R0981 Nasal congestion: Secondary | ICD-10-CM | POA: Diagnosis not present

## 2023-01-13 DIAGNOSIS — J301 Allergic rhinitis due to pollen: Secondary | ICD-10-CM | POA: Diagnosis not present

## 2023-01-13 DIAGNOSIS — R519 Headache, unspecified: Secondary | ICD-10-CM

## 2023-01-13 MED ORDER — GUAIFENESIN ER 1200 MG PO TB12: 1200.0000 mg | ORAL_TABLET | Freq: Two times a day (BID) | ORAL | 0 refills | Status: DC

## 2023-01-13 NOTE — ED Triage Notes (Signed)
Patient here today for sinus symptoms X 1 week. She c/o nasal drainage, stuffy nose, cough, and HA. No fever. She has been taking Nyquil, Theraflu with some improvement. She has also been having some neck pain. No sick contacts. No recent travel.

## 2023-01-13 NOTE — ED Provider Notes (Signed)
[facility]    CSN: [id number] Arrival date & time: 01/13/23  1001      History   Chief Complaint Chief Complaint  Patient presents with   Nasal Congestion    HPI Andrea Scott is a 22 y.o. female.   Patient presents to urgent care for evaluation of nasal congestion, sneezing, sore throat, frontal headache, and neck pain that started approximately 1 week ago.  She states she has severe seasonal allergies and currently takes Zyrtec 10 mg as well as Flonase 2 puffs in each nostril daily.  She has been taking her allergy medications as prescribed and has not missed any doses.  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  Patient has a history of frequent headaches and states she usually takes either Tylenol sinus or Excedrin to help with this. Headache is described as a pressure and is relieved by Tylenol sinus medication as needed.  Denies vision changes, extremity weakness, paresthesias, photophobia, and phonophobia.  Denies recent known sick contacts with similar symptoms and recent antibiotic/steroid use.  Symptoms started with sore throat then progressed to worsening nasal congestion.  She denies cough, fever/chills, dizziness, nausea, vomiting, diarrhea, abdominal pain, rash, generalized fatigue, body aches, and weakness.  She states her nasal congestion is getting better overall.  States this seems to happen around this time every year.  She has taken ibuprofen 800 mg 1 time 2 to 3 days ago and states this helped with headache.     History reviewed. No pertinent past medical history.  Patient Active Problem List   Diagnosis Date Noted   Non-obstetric vaginal laceration without foreign body or perineal laceration     Past Surgical History:  Procedure Laterality Date   BREAST REDUCTION SURGERY  2019   PERINEAL LACERATION REPAIR N/A 03/02/2018   Procedure: SUTURE REPAIR VAGINAL LACERATION;  Surgeon: Florian Buff, MD;  Location: [facility];  Service: Gynecology;  Laterality: N/A;  repair of vaginal laceration   VULVECTOMY PARTIAL Bilateral 10/05/2015   Procedure: VULVECTOMY PARTIAL;  Surgeon: Servando Salina, MD;  Location: [facility];  Service: Gynecology;  Laterality: Bilateral;  45 minutes    OB History   No obstetric history on file.      Home Medications    Prior to Admission medications   Medication Sig Start Date End Date Taking? Authorizing Provider  cetirizine (ZYRTEC) 5 MG tablet Take 5 mg by mouth daily.   Yes [provider]  Guaifenesin 1200 MG TB12 Take 1 tablet (1,200 mg total) by mouth in the morning and at bedtime. 01/13/23  Yes Talbot Grumbling, FNP  ibuprofen (ADVIL,MOTRIN) 800 MG tablet Take 1 tablet (800 mg total) by mouth every 8 (eight) hours as needed. 03/02/18  Yes Florian Buff, MD  OVER THE COUNTER MEDICATION Take 1 tablet by mouth daily. Patient takes over the counter Vitamin D   Yes [provider]  HYDROcodone-acetaminophen (NORCO/VICODIN) 5-325 MG tablet Take 1 tablet by mouth every 6 (six) hours as needed. 03/02/18   Florian Buff, MD  meloxicam (MOBIC) 7.5 MG tablet Take 1 tablet (7.5 mg total) by mouth daily as needed for pain. 04/12/22   Trula Slade, DPM  methocarbamol (ROBAXIN) 500 MG tablet Take 1 tablet (500 mg total) by mouth 2 (two) times daily. 08/05/22   Smoot, Leary Roca, PA-C  methylPREDNISolone (MEDROL DOSEPAK) 4 MG TBPK tablet Take as directed 04/12/22   Trula Slade, DPM  pantoprazole (PROTONIX) 20 MG tablet Take 1 tablet (20 mg total) by mouth daily. 05/16/21   Pearson Forster, NP    Family History History reviewed. No pertinent family history.  Social History Social History   Tobacco Use   Smoking status: Never   Smokeless tobacco: Never  Vaping Use   Vaping Use: Never used  Substance Use Topics   Alcohol use: Yes    Comment: occasional   Drug use: No     Allergies   Patient has no known  allergies.   Review of Systems Review of Systems Per HPI  Physical Exam Triage Vital Signs ED Triage Vitals  Enc Vitals Group     BP 01/13/23 1018 136/84     Pulse Rate 01/13/23 1018 77     Resp 01/13/23 1018 16     Temp 01/13/23 1018 99.2 F (37.3 C)     Temp Source 01/13/23 1018 Oral     SpO2 01/13/23 1018 96 %     Weight 01/13/23 1018 260 lb (117.9 kg)     Height 01/13/23 1018 5' 5.25" (1.657 m)     Head Circumference --      Peak Flow --      Pain Score 01/13/23 1017 3     Pain Loc --      Pain Edu? --      Excl. in [place]? --    No data found.  Updated Vital Signs BP 136/84 (BP Location: Right Arm)   Pulse 77   Temp 99.2 F (37.3 C) (Oral)   Resp 16   Ht 5' 5.25" (1.657 m)   Wt 260 lb (117.9 kg)   LMP 11/29/2022 (Approximate)   SpO2 96%   BMI 42.94 kg/m   Visual Acuity Right Eye Distance:   Left Eye Distance:   Bilateral Distance:    Right Eye Near:   Left Eye Near:    Bilateral Near:     Physical Exam Vitals and nursing note reviewed.  Constitutional:      Appearance: She is not ill-appearing or toxic-appearing.  HENT:     Head: Normocephalic and atraumatic.     Right Ear: Hearing, tympanic membrane, ear canal and external ear normal.     Left Ear: Hearing, tympanic membrane, ear canal and external ear normal.     Nose: Congestion present.     Mouth/Throat:     Lips: Pink.     Mouth: Mucous membranes are moist. No injury.     Tongue: No lesions. Tongue does not deviate from midline.     Palate: No mass and lesions.     Pharynx: Oropharynx is clear. Uvula midline. No pharyngeal swelling, oropharyngeal exudate, posterior oropharyngeal erythema or uvula swelling.     Tonsils: No tonsillar exudate or tonsillar abscesses.  Eyes:     General: Lids are normal. Vision grossly intact. Gaze aligned appropriately.     Extraocular Movements: Extraocular movements intact.     Conjunctiva/sclera: Conjunctivae normal.  Cardiovascular:     Rate and Rhythm:  Normal rate and regular rhythm.     Heart sounds: Normal heart sounds, S1 normal and S2 normal.  Pulmonary:     Effort: Pulmonary effort is normal. No respiratory distress.     Breath sounds: Normal breath sounds and air entry. No wheezing.  Musculoskeletal:     Cervical back: Neck supple.  Lymphadenopathy:     Cervical: No cervical adenopathy.  Skin:    General: Skin is warm and dry.  Capillary Refill: Capillary refill takes less than 2 seconds.     Findings: No rash.  Neurological:     General: No focal deficit present.     Mental Status: She is alert and oriented to person, place, and time. Mental status is at baseline.     Cranial Nerves: No dysarthria or facial asymmetry.  Psychiatric:        Mood and Affect: Mood normal.        Speech: Speech normal.        Behavior: Behavior normal.        Thought Content: Thought content normal.        Judgment: Judgment normal.      UC Treatments / Results  Labs (all labs ordered are listed, but only abnormal results are displayed) Labs Reviewed - No data to display  EKG   Radiology No results found.  Procedures Procedures (including critical care time)  Medications Ordered in UC Medications - No data to display  Initial Impression / Assessment and Plan / UC Course  I have reviewed the triage vital signs and the nursing notes.  Pertinent labs & imaging results that were available during my care of the patient were reviewed by me and considered in my medical decision making (see chart for details).   1.  Seasonal allergic rhinitis due to pollen, nasal congestion, bad headache Presentation is consistent with worsening seasonal allergies.  Low suspicion for acute bacterial sinusitis etiology, therefore will defer antibiotic use.  She is overall well-appearing with hemodynamically stable vital signs and clear cardiopulmonary exam, therefore deferred imaging.  Neurologic exam is without focal deficit.  May continue using  ibuprofen 600 mg every 6 hours as needed for head pain.  May continue using Flonase and Zyrtec as prescribed, however I would like to add on Mucinex to help thin mucus so that she is able to excrete it easier and help with nasal congestion.  She is agreeable with this plan.  Advised to follow-up with PCP if symptoms fail to improve in the next 4 to 5 days with use of medications.  Compresses to the face and the neck recommended.  Deferred imaging of the neck based on stable musculoskeletal exam and a traumatic presentation of neck pain.  Discussed physical exam and available lab work findings in clinic with patient.  Counseled patient regarding appropriate use of medications and potential side effects for all medications recommended or prescribed today. Discussed red flag signs and symptoms of worsening condition,when to call the PCP office, return to urgent care, and when to seek higher level of care in the emergency department. Patient verbalizes understanding and agreement with plan. All questions answered. Patient discharged in stable condition.    Final Clinical Impressions(s) / UC Diagnoses   Final diagnoses:  Seasonal allergic rhinitis due to pollen  Nasal congestion  Bad headache     Discharge Instructions      Continue allergy medicines.  Add on mucinex to help with nasal congestion every 12 hours as needed for congestion. Apply warm compresses to the face and neck to relieve discomfort.  Ibuprofen 600mg  every 6 hours as needed for headache. May use 1,000mg  tylenol as needed as well for pain as needed.  If you develop any new or worsening symptoms or do not improve in the next 2 to 3 days, please return.  If your symptoms are severe, please go to the emergency room.  Follow-up with your primary care provider for further evaluation and management of  your symptoms as well as ongoing wellness visits.  I hope you feel better!   ED Prescriptions     Medication Sig Dispense Auth.  Provider   Guaifenesin 1200 MG TB12 Take 1 tablet (1,200 mg total) by mouth in the morning and at bedtime. 14 tablet Talbot Grumbling, FNP      PDMP not reviewed this encounter.   Talbot Grumbling, [place] 01/13/23 1108

## 2023-01-13 NOTE — Discharge Instructions (Signed)
Continue allergy medicines.  Add on mucinex to help with nasal congestion every 12 hours as needed for congestion. Apply warm compresses to the face and neck to relieve discomfort.  Ibuprofen 600mg  every 6 hours as needed for headache. May use 1,000mg  tylenol as needed as well for pain as needed.  If you develop any new or worsening symptoms or do not improve in the next 2 to 3 days, please return.  If your symptoms are severe, please go to the emergency room.  Follow-up with your primary care provider for further evaluation and management of your symptoms as well as ongoing wellness visits.  I hope you feel better!

## 2023-04-03 NOTE — Progress Notes (Signed)
Cardiology Office Note:   Date:  04/04/2023  NAME:  Andrea Scott    MRN: [id number] DOB:  15-Oct-2001   PCP:  Pcp, No  Cardiologist:  None  Electrophysiologist:  None   Referring MD: Shon Hale, *   Chief Complaint  Patient presents with   Chest Pain    History of Present Illness:   Andrea Scott is a 22 y.o. female with a hx of allergies who is being seen today for the evaluation of chest pain at the request of Shon Hale, *.  She reports for the past 2 years she has had episodes of chest tightness.  She describes spells where she gets tight in her chest.  She also describes shortness of breath as well as numbness and tingling in the left arm.  Symptoms occur once per month.  She has been put on acid reflux medicine but still happening.  She reports some anxiety when it happens but she is unsure what triggers first the episodes or the anxiety.  Her medical history is significant for PCOS and obesity.  BMI 45.  Her primary care physician may work her up for sleep apnea.  She has never had a heart attack or stroke but does report heart failure in the family.  A paternal uncle has heart failure.  She is concerned she may have heart disease.  CV exam normal.  EKG is normal.  She does not smoke.  No alcohol or drug use is reported.  She works for [employer].  She also has migraines.  She been placed on propranolol.  Blood pressure is stable.  CV exam normal.  Symptoms do not appear to be triggered by activity.  They occur at rest.  Her symptoms of chest tightness and tingling can last minutes to hours.  No identifiable triggers.  No alleviating factors other than time.  Problem List Morbid obesity -BMI 44  Past Medical History: Past Medical History:  Diagnosis Date   Migraine    PCOS (polycystic ovarian syndrome)     Past Surgical History: Past Surgical History:  Procedure Laterality Date   BREAST REDUCTION SURGERY  2019   PERINEAL LACERATION REPAIR N/A 03/02/2018    Procedure: SUTURE REPAIR VAGINAL LACERATION;  Surgeon: Lazaro Arms, MD;  Location: [facility];  Service: Gynecology;  Laterality: N/A;  repair of vaginal laceration   VULVECTOMY PARTIAL Bilateral 10/05/2015   Procedure: VULVECTOMY PARTIAL;  Surgeon: Maxie Better, MD;  Location: [facility];  Service: Gynecology;  Laterality: Bilateral;  45 minutes    Current Medications: Current Meds  Medication Sig   aspirin-acetaminophen-caffeine (EXCEDRIN MIGRAINE) 250-250-65 MG tablet 2 tablets Orally Once a day for 30 day(s)   cetirizine (ZYRTEC) 5 MG tablet Take 5 mg by mouth daily.   Cholecalciferol (VITAMIN D-1000 MAX ST) 25 MCG (1000 UT) tablet 2 tablets Orally Once a day   Coenzyme Q10 10 MG capsule as directed Orally   fluticasone (FLONASE) 50 MCG/ACT nasal spray 2  sprays Nasally Once a day for 30 day(s)   Guaifenesin 1200 MG TB12 Take 1 tablet (1,200 mg total) by mouth in the morning and at bedtime.   metFORMIN (GLUCOPHAGE-XR) 500 MG 24 hr tablet 1 tablet with evening meal Orally Once a day for 90 days   norethindrone (MICRONOR) 0.35 MG tablet Take 1 tablet by mouth daily.   propranolol (INDERAL) 20 MG tablet 1 tablet Orally twice a day for 30 days   [DISCONTINUED] levocetirizine (XYZAL) 5 MG tablet TK 1 T  PO ONCE A DAY IN THE EVE   [DISCONTINUED] medroxyPROGESTERone (PROVERA) 10 MG tablet Take 10 mg by mouth daily.     Allergies:    Patient has no known allergies.   Social History: Social History   Socioeconomic History   Marital status: Single    Spouse name: Not on file   Number of children: Not on file   Years of education: Not on file   Highest education level: Not on file  Occupational History   Occupation: [employer]  Tobacco Use   Smoking status: Never   Smokeless tobacco: Never  Vaping Use   Vaping Use: Never used  Substance and Sexual Activity   Alcohol use: Yes    Comment: occasional   Drug use: No   Sexual activity: Yes    Birth control/protection: None  Other Topics  Concern   Not on file  Social History Narrative   Not on file   Social Determinants of Health   Financial Resource Strain: Not on file  Food Insecurity: Not on file  Transportation Needs: Not on file  Physical Activity: Not on file  Stress: Not on file  Social Connections: Not on file     Family History: The patient's family history includes Heart failure in her paternal uncle.  ROS:   All other ROS reviewed and negative. Pertinent positives noted in the HPI.     EKGs/Labs/Other Studies Reviewed:   The following studies were personally reviewed by me today:  EKG:  EKG is ordered today.    EKG Interpretation  Date/Time:  Thursday April 04 2023 13:28:08 EDT Ventricular Rate:  75 PR Interval:  166 QRS Duration: 76 QT Interval:  364 QTC Calculation: 406 R Axis:   18 Text Interpretation: Normal sinus rhythm Normal ECG Confirmed by Lennie Odor [phone]) on 04/04/2023 1:29:06 PM   Recent Labs: 07/12/2022: BUN 10; Creatinine, Ser 1.01; Hemoglobin 12.6; Platelets 313; Potassium 3.7; Sodium 136   Recent Lipid Panel No results found for: "CHOL", "TRIG", "HDL", "CHOLHDL", "VLDL", "LDLCALC", "LDLDIRECT"  Physical Exam:   VS:  BP 116/78   Pulse 77   Ht 5\' 5"  (1.651 m)   Wt 273 lb 3.2 oz (123.9 kg)   BMI 45.46 kg/m    Wt Readings from Last 3 Encounters:  04/04/23 273 lb 3.2 oz (123.9 kg)  01/13/23 260 lb (117.9 kg)  08/05/22 259 lb (117.5 kg)    General: Well nourished, well developed, in no acute distress Head: Atraumatic, normal size  Eyes: PEERLA, EOMI  Neck: Supple, no JVD Endocrine: No thryomegaly Cardiac: Normal S1, S2; RRR; no murmurs, rubs, or gallops Lungs: Clear to auscultation bilaterally, no wheezing, rhonchi or rales  Abd: Soft, nontender, no hepatomegaly  Ext: No edema, pulses 2+ Musculoskeletal: No deformities, BUE and BLE strength normal and equal Skin: Warm and dry, no rashes   Neuro: Alert and oriented to person, place, time, and situation, CNII-XII  grossly intact, no focal deficits  Psych: Normal mood and affect   ASSESSMENT:   [name] is a 22 y.o. female who presents for the following: 1. Precordial pain   2. Obesity, morbid, BMI 50 or higher (HCC)     PLAN:   1. Precordial pain 2. Obesity, morbid, BMI 50 or higher (HCC) -Suspect noncardiac chest pain.  Partially improved with Prilosec.  He does have a history of heart failure in the family.  Paternal uncle.  Her EKG is normal.  Her CV exam is normal.  We discussed an echo  and exercise treadmill stress test to further reassure her that this is not her heart.  I think she would do well with reassurance if both tests are normal.  She will see [place] back as needed based on the results of the test.   Informed Consent   Shared Decision Making/Informed Consent The risks [chest pain, shortness of breath, cardiac arrhythmias, dizziness, blood pressure fluctuations, myocardial infarction, stroke/transient ischemic attack, and life-threatening complications (estimated to be 1 in 10,000)], benefits (risk stratification, diagnosing coronary artery disease, treatment guidance) and alternatives of an exercise tolerance test were discussed in detail with Ms. Puerto and she agrees to proceed.     Disposition: Return if symptoms worsen or fail to improve.  Medication Adjustments/Labs and Tests Ordered: Current medicines are reviewed at length with the patient today.  Concerns regarding medicines are outlined above.  Orders Placed This Encounter  Procedures   Cardiac Stress Test: Informed Consent Details: Physician/Practitioner Attestation; Transcribe to consent form and obtain patient signature   EXERCISE TOLERANCE TEST (ETT)   EKG 12-Lead   EKG 12-Lead   ECHOCARDIOGRAM COMPLETE   No orders of the defined types were placed in this encounter.  Patient Instructions  Medication Instructions:  The current medical regimen is effective;  continue present plan and medications.  *If you need a  refill on your cardiac medications before your next appointment, please call your pharmacy*   Testing/Procedures: Echocardiogram - Your physician has requested that you have an echocardiogram. Echocardiography is a painless test that uses sound waves to create images of your heart. It provides your doctor with information about the size and shape of your heart and how well your heart's chambers and valves are working. This procedure takes approximately one hour. There are no restrictions for this procedure.   Your physician has requested that you have an exercise tolerance test, this is a screening tool to track your fitness level. This test evaluates the your exercise capacity by measuring cardiovascular response to exercise, the stress response is induced by exercise (exercise-treadmill).  Graded exercise test is also known as maximal exercise test or stress EKG test  . Please also follow instruction sheet given.   Follow-Up: At [facility], you and your health needs are our priority.  As part of our continuing mission to provide you with exceptional heart care, we have created designated Provider Care Teams.  These Care Teams include your primary Cardiologist (physician) and Advanced Practice Providers (APPs -  Physician Assistants and Nurse Practitioners) who all work together to provide you with the care you need, when you need it.  We recommend signing up for the patient portal called "MyChart".  Sign up information is provided on this After Visit Summary.  MyChart is used to connect with patients for Virtual Visits (Telemedicine).  Patients are able to view lab/test results, encounter notes, upcoming appointments, etc.  Non-urgent messages can be sent to your provider as well.   To learn more about what you can do with MyChart, go to [email or web address].    Your next appointment:  As needed  Provider:   Lennie Odor, MD      Signed, Lenna Gilford. Flora Lipps, MD, [facility]  [facility]  [address], [address] [place], [place] [address] [phone]  04/04/2023 1:56 PM

## 2023-04-04 ENCOUNTER — Encounter: Payer: Self-pay | Admitting: Cardiovascular Disease

## 2023-04-04 ENCOUNTER — Ambulatory Visit: Payer: Commercial Managed Care - PPO | Attending: Cardiovascular Disease | Admitting: Cardiovascular Disease

## 2023-04-04 VITALS — BP 116/78 | HR 77 | Ht 65.0 in | Wt 273.2 lb

## 2023-04-04 DIAGNOSIS — R072 Precordial pain: Secondary | ICD-10-CM | POA: Diagnosis not present

## 2023-04-04 NOTE — Patient Instructions (Signed)
Medication Instructions:  The current medical regimen is effective;  continue present plan and medications.  *If you need a refill on your cardiac medications before your next appointment, please call your pharmacy*   Testing/Procedures: Echocardiogram - Your physician has requested that you have an echocardiogram. Echocardiography is a painless test that uses sound waves to create images of your heart. It provides your doctor with information about the size and shape of your heart and how well your heart's chambers and valves are working. This procedure takes approximately one hour. There are no restrictions for this procedure.   Your physician has requested that you have an exercise tolerance test, this is a screening tool to track your fitness level. This test evaluates the your exercise capacity by measuring cardiovascular response to exercise, the stress response is induced by exercise (exercise-treadmill).  Graded exercise test is also known as maximal exercise test or stress EKG test  . Please also follow instruction sheet given.   Follow-Up: At [facility], you and your health needs are our priority.  As part of our continuing mission to provide you with exceptional heart care, we have created designated Provider Care Teams.  These Care Teams include your primary Cardiologist (physician) and Advanced Practice Providers (APPs -  Physician Assistants and Nurse Practitioners) who all work together to provide you with the care you need, when you need it.  We recommend signing up for the patient portal called "MyChart".  Sign up information is provided on this After Visit Summary.  MyChart is used to connect with patients for Virtual Visits (Telemedicine).  Patients are able to view lab/test results, encounter notes, upcoming appointments, etc.  Non-urgent messages can be sent to your provider as well.   To learn more about what you can do with MyChart, go to [email or web address].     Your next appointment:  As needed  Provider:   Lennie Odor, MD

## 2023-04-05 ENCOUNTER — Other Ambulatory Visit: Payer: Self-pay | Admitting: Family Medicine

## 2023-04-05 DIAGNOSIS — G43009 Migraine without aura, not intractable, without status migrainosus: Secondary | ICD-10-CM

## 2023-05-03 ENCOUNTER — Encounter: Payer: Self-pay | Admitting: Internal Medicine

## 2023-05-03 ENCOUNTER — Ambulatory Visit (HOSPITAL_COMMUNITY): Payer: Commercial Managed Care - PPO | Attending: Cardiovascular Disease

## 2023-05-03 DIAGNOSIS — R072 Precordial pain: Secondary | ICD-10-CM | POA: Insufficient documentation

## 2023-05-03 LAB — ECHOCARDIOGRAM COMPLETE
Area-P 1/2: 5.27 cm2
Calc EF: 62.4 %
S' Lateral: 3 cm
Single Plane A2C EF: 63.5 %
Single Plane A4C EF: 62.5 %

## 2023-05-07 ENCOUNTER — Telehealth (HOSPITAL_COMMUNITY): Payer: Self-pay

## 2023-05-07 NOTE — Telephone Encounter (Signed)
Spoke with the patient, detailed instructions given. She stated that she would be here for her test. Asked to call back with any questions. S.Williams EMTP/CCT

## 2023-05-13 ENCOUNTER — Ambulatory Visit (HOSPITAL_COMMUNITY): Payer: Commercial Managed Care - PPO | Attending: Cardiovascular Disease

## 2023-05-13 ENCOUNTER — Encounter: Payer: Self-pay | Admitting: Cardiovascular Disease

## 2023-05-13 DIAGNOSIS — R072 Precordial pain: Secondary | ICD-10-CM | POA: Diagnosis present

## 2023-05-13 LAB — EXERCISE TOLERANCE TEST
Estimated workload: 7
Exercise duration (min): 6 min
MPHR: 197 {beats}/min
Peak HR: 200 {beats}/min
Percent HR: 101 %
RPE: 18
Rest HR: 103 {beats}/min

## 2023-05-15 ENCOUNTER — Encounter: Payer: Self-pay | Admitting: Family Medicine

## 2023-05-17 ENCOUNTER — Ambulatory Visit
Admission: RE | Admit: 2023-05-17 | Discharge: 2023-05-17 | Disposition: A | Discharge status: Home / Self Care | Payer: Commercial Managed Care - PPO | Source: Ambulatory Visit | Attending: Family Medicine | Admitting: Family Medicine

## 2023-05-17 DIAGNOSIS — G43009 Migraine without aura, not intractable, without status migrainosus: Secondary | ICD-10-CM

## 2023-05-17 MED ORDER — GADOPICLENOL 0.5 MMOL/ML IV SOLN
10.0000 mL | Freq: Once | INTRAVENOUS | Status: AC | PRN
  Administered 2023-05-17: 10 mL via INTRAVENOUS

## 2023-06-10 ENCOUNTER — Telehealth: Payer: Self-pay | Admitting: Psychiatry

## 2023-06-10 NOTE — Telephone Encounter (Signed)
LVM and sent mychart msg informing pt of need to reschedule 06/14/23 appt - MD out

## 2023-06-14 ENCOUNTER — Ambulatory Visit: Payer: Self-pay | Admitting: Psychiatry

## 2023-06-26 ENCOUNTER — Ambulatory Visit: Payer: Commercial Managed Care - PPO | Admitting: Psychiatry

## 2023-06-26 ENCOUNTER — Encounter: Payer: Self-pay | Admitting: Psychiatry

## 2023-06-26 VITALS — BP 121/73 | HR 86 | Ht 65.5 in | Wt 276.2 lb

## 2023-06-26 DIAGNOSIS — G932 Benign intracranial hypertension: Secondary | ICD-10-CM | POA: Diagnosis not present

## 2023-06-26 DIAGNOSIS — E236 Other disorders of pituitary gland: Secondary | ICD-10-CM | POA: Diagnosis not present

## 2023-06-26 MED ORDER — TOPIRAMATE 25 MG PO TABS: ORAL_TABLET | ORAL | 6 refills | Status: DC

## 2023-06-26 NOTE — Progress Notes (Signed)
Referring:  Shon Hale, MD [address] [place],  [place] [address]  PCP: Pcp, No  Neurology was asked to evaluate Lyndsy Dispirito, a 22 year old female for a chief complaint of headaches.  Our recommendations of care will be communicated by shared medical record.    CC:  headaches  History provided from self  HPI:  Medical co-morbidities: prediabetes  The patient presents for evaluation of headaches. She has a history of migraines which began when she was 22 years old, but headaches have started worsening in the past few months. Headache frequency varies-- she can have between 3-7 headaches per week. They are associated with photophobia and nausea. States they feel like her typical migraines. Takes Excedrin as needed which does help, but she is concerned she is taking this too frequently. She has not noticed any significant vision changes or pulsatile tinnitus. Headaches can be exacerbated by bending forward. She has not had an eye exam recently.  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  She is currently undergoing evaluation for OSA. Is also planning to have bariatric surgery next month.  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  Headache History: Onset:  age 89, worsening in the past 3 months Triggers: bright lights, bending forward Aura: none Location: left retro-orbital Quality/Description: pressure Associated Symptoms:  Photophobia: yes  Phonophobia: no  Nausea: sometimes Worse with activity?: yes Duration of headaches: 30 minutes-2 hours with medication. Can last several hours untreated  Migraine days per month: 15 Headache free days per month: 15  Current Treatment: Abortive Excedrin  Preventative none  Prior Therapies                                 Propranolol - ineffective   LABS: CBC    Component Value Date/Time   WBC 5.4 07/12/2022 2322   RBC 4.34 07/12/2022 2322   HGB 12.6  07/12/2022 2322   HCT 38.4 07/12/2022 2322   PLT 313 07/12/2022 2322   MCV 88.5 07/12/2022 2322   MCH 29.0 07/12/2022 2322   MCHC 32.8 07/12/2022 2322   RDW 13.0 07/12/2022 2322   LYMPHSABS 1.8 07/12/2022 2322   MONOABS 0.5 07/12/2022 2322   EOSABS 0.1 07/12/2022 2322   BASOSABS 0.0 07/12/2022 2322      Latest Ref Rng & Units 07/12/2022   11:22 PM 02/12/2012    9:27 PM  CMP  Glucose 70 - 99 mg/dL 98  87   BUN 6 - 20 mg/dL 10  18   Creatinine 1.91 - 1.00 mg/dL 4.78  2.95   Sodium 621 - 145 mmol/L 136  139   Potassium 3.5 - 5.1 mmol/L 3.7  3.8   Chloride 98 - 111 mmol/L 102  105   CO2 22 - 32 mmol/L 24  23   Calcium 8.9 - 10.3 mg/dL 9.0  9.6   Total Protein 6.0 - 8.3 g/dL  7.2   Total Bilirubin 0.3 - 1.2 mg/dL  0.2   Alkaline Phos 51 - 332 U/L  120   AST 0 - 37 U/L  17   ALT 0 - 35 U/L  10      IMAGING:  MRI brain 05/17/23: 1. Partially empty sella turcica. Vertical tortuosity of the intraorbital optic nerves. These findings can be seen in the setting of idiopathic intracranial hypertension (pseudotumor cerebri). 2. Otherwise unremarkable MRI appearance  of the brain. 3. Mild left sphenoid sinus mucosal thickening.  Imaging independently reviewed on June 26, 2023   Current Outpatient Medications on File Prior to Visit  Medication Sig Dispense Refill   aspirin-acetaminophen-caffeine (EXCEDRIN MIGRAINE) 250-250-65 MG tablet 2 tablets Orally Once a day for 30 day(s)     cetirizine (ZYRTEC) 5 MG tablet Take 5 mg by mouth daily.     Cholecalciferol (VITAMIN D-1000 MAX ST) 25 MCG (1000 UT) tablet 2 tablets Orally Once a day     fluticasone (FLONASE) 50 MCG/ACT nasal spray 2  sprays Nasally Once a day for 30 day(s)     Coenzyme Q10 10 MG capsule as directed Orally (Patient not taking: Reported on 06/26/2023)     Guaifenesin 1200 MG TB12 Take 1 tablet (1,200 mg total) by mouth in the morning and at bedtime. (Patient not taking: Reported on 06/26/2023) 14 tablet 0   metFORMIN  (GLUCOPHAGE-XR) 500 MG 24 hr tablet 1 tablet with evening meal Orally Once a day for 90 days (Patient not taking: Reported on 06/26/2023)     norethindrone (MICRONOR) 0.35 MG tablet Take 1 tablet by mouth daily. (Patient not taking: Reported on 06/26/2023)     propranolol (INDERAL) 20 MG tablet 1 tablet Orally twice a day for 30 days (Patient not taking: Reported on 06/26/2023)     No current facility-administered medications on file prior to visit.     Allergies: No Known Allergies  Family History: Family History  Problem Relation Age of Onset   High blood pressure Mother    Heart failure Paternal Uncle    Kidney failure Paternal Grandmother      Past Medical History: Past Medical History:  Diagnosis Date   Migraine    PCOS (polycystic ovarian syndrome)     Past Surgical History Past Surgical History:  Procedure Laterality Date   BREAST REDUCTION SURGERY  2019   PERINEAL LACERATION REPAIR N/A 03/02/2018   Procedure: SUTURE REPAIR VAGINAL LACERATION;  Surgeon: Lazaro Arms, MD;  Location: [facility];  Service: Gynecology;  Laterality: N/A;  repair of vaginal laceration   VULVECTOMY PARTIAL Bilateral 10/05/2015   Procedure: VULVECTOMY PARTIAL;  Surgeon: Maxie Better, MD;  Location: [facility];  Service: Gynecology;  Laterality: Bilateral;  45 minutes    Social History: Social History   Tobacco Use   Smoking status: Never   Smokeless tobacco: Never  Vaping Use   Vaping status: Never Used  Substance Use Topics   Alcohol use: Yes    Comment: occasional   Drug use: No    ROS: Negative for fevers, chills. Positive for headaches. All other systems reviewed and negative unless stated otherwise in HPI.   Physical Exam:   Vital Signs: BP 121/73 (BP Location: Left Wrist, Patient Position: Sitting, Cuff Size: Normal)   Pulse 86   Ht 5' 5.5" (1.664 m)   Wt 276 lb 3.2 oz (125.3 kg)   BMI 45.26 kg/m  GENERAL: well appearing,in no acute distress,alert SKIN:  Color,  texture, turgor normal. No rashes or lesions HEAD:  Normocephalic/atraumatic. CV:  RRR RESP: Normal respiratory effort  NEUROLOGICAL: Mental Status: Alert, oriented to person, place and time,Follows commands Cranial Nerves: PERRL, fundi not well visualized but some possible blurring of disc margins OS, visual fields intact to confrontation though patient notices some blurriness of her vision, extraocular movements intact, facial sensation intact, no facial droop or ptosis Motor: muscle strength 5/5 both upper and lower extremities,no drift, normal tone Reflexes: 2+ throughout Sensation: intact  to light touch all 4 extremities Coordination: Finger-to- nose-finger intact bilaterally Gait: normal-based   IMPRESSION: 22 year old female with a history of prediabetes, migraines who presents for evaluation of worsening headaches and empty sella seen on MRI. She does report some blurring of vision today on exam. Fundi are not well visualized but there appears to be some blurring of her optic disc margin OS. Will refer to ophthalmology for a formal eye exam to assess for papilledema. Discussed LP as a next step if there is papilledema on her ophthalmology exam. 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  PLAN: -Start Topamax 25 mg at bedtime x1 week, then 25 BID x 1 week, then 25/50 x 1 week, then 50 mg BID. Uptitrate as needed (avoid doses >200 mg/day as patient is on birth control) -Referral to ophthalmology -Next steps: consider CTV and LP if ophtho exam with papilledema   I spent a total of 41 minutes chart reviewing and counseling the patient. Headache education was done. Discussed treatment options including preventive medications. Discussed medication side effects, adverse reactions and drug interactions. Written educational materials and patient instructions outlining all of the above were given.  Follow-up: 6 months, or sooner if needed   Ocie Doyne,  MD 06/26/2023   10:35 AM

## 2023-06-28 ENCOUNTER — Telehealth: Payer: Self-pay | Admitting: Psychiatry

## 2023-06-28 DIAGNOSIS — G932 Benign intracranial hypertension: Secondary | ICD-10-CM

## 2023-06-28 NOTE — Telephone Encounter (Signed)
Referral  for ophthalmology fax to [facility]. Phone: [phone], Fax: [phone].

## 2023-07-16 NOTE — Telephone Encounter (Signed)
She is scheduled for 10/3.

## 2023-07-16 NOTE — Addendum Note (Signed)
Addended by: Levert Feinstein on: 07/16/2023 04:00 PM   Modules accepted: Orders

## 2023-07-16 NOTE — Telephone Encounter (Signed)
Received phone call from patient stating she had her appointment with Dr. Dione Booze and they did find swelling on her exam. She called them and asked them to send over their notes to [place] ASAP today since she had the appt with them last week. Patient wanted to move her f/u appt sooner as she needs clearance for her bariatric surgery coming up, I let her know I'm not sure that a sooner appt is necessary at this time. Once we get Dr. Lucious Groves notes will have provider review and advise patient on next steps. She is wanting advisement asap as she is scheduled to have bariatric surgery tomorrow but they want her to have our clearance for that.

## 2023-07-16 NOTE — Telephone Encounter (Signed)
Dr. 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

## 2023-07-16 NOTE — Telephone Encounter (Signed)
Received office notes from Dr. Lucious Groves office. Bringing for review

## 2023-07-16 NOTE — Telephone Encounter (Signed)
MRI of the brain in August 2024 showed partially empty sella, otherwise no significant abnormality  Abnormal ophthalmology findings, papillary edema can be associated with her weight issue,  She should be okay to proceed with elective bariatric surgery  After she recovered from that, may consider fluoroscopy guided lumbar puncture for evaluation of intracranial hypertension, bariatric surgery will help her condition (optic nerve swelling )as well

## 2023-07-16 NOTE — Telephone Encounter (Signed)
Called and spoke to patient about surgery, she has a clearance letter for [place] to fill out she will bring tomorrow. Her surgery was cancelled for tomorrow so she is rescheduling and needs clearance to proceed. She is concerned with edema and the procedure. I advised her to call back if she has any other questions. She was appreciative of the call.

## 2023-07-16 NOTE — Telephone Encounter (Signed)
Sent to [place] @ [facility] as urgent.

## 2023-07-16 NOTE — Telephone Encounter (Signed)
Called and spoke to patient about the LP that was ordered and she was agreeable to plan but wanted to know if it could be marked as urgent due to the need for clearance for a surgery that was scheduled tomorrow and cancelled due to optic nerve swelling. I will mark this note as urgent for IR.

## 2023-07-17 ENCOUNTER — Telehealth: Payer: Self-pay | Admitting: Psychiatry

## 2023-07-17 NOTE — Telephone Encounter (Signed)
Pt asking How long do I have to wait after lumbar puncture to have gastric surgery and when do I need to bring the clearance form for gastric surgery for Dr. Terrace Arabia. Also would like the physical copy back. Would like a call from the nurse.

## 2023-07-17 NOTE — Telephone Encounter (Signed)
Called and spoke to patient who reports she will bring clearance form by on Monday to be completed by Dr. Terrace Arabia. She states she is scheduled for lumbar puncture tomorrow

## 2023-07-17 NOTE — Discharge Instructions (Signed)

## 2023-07-17 NOTE — Telephone Encounter (Signed)
Post Lumbar puncture 2 weeks would be fine,

## 2023-07-18 ENCOUNTER — Ambulatory Visit
Admission: RE | Admit: 2023-07-18 | Discharge: 2023-07-18 | Disposition: A | Discharge status: Home / Self Care | Payer: Commercial Managed Care - PPO | Source: Ambulatory Visit | Attending: Neurology | Admitting: Neurology

## 2023-07-18 VITALS — BP 115/71 | HR 61

## 2023-07-18 DIAGNOSIS — G932 Benign intracranial hypertension: Secondary | ICD-10-CM

## 2023-07-22 NOTE — Telephone Encounter (Signed)
Faxed surgical clearance form and filed it in the drawer for record keeping to [facility]. Provided pt a copy since she was waiting in the lobby.

## 2023-07-22 NOTE — Telephone Encounter (Signed)
Pt brought form into office, states she since was told to wait 2 weeks after lumbar puncture for surgery. Next surgery date is 23rd of October. Wanted to make sure that the 23rd is an okay date to schedule for and if so would like this written on clearance form.

## 2023-08-19 LAB — VDRL, CSF: VDRL Quant, CSF: NONREACTIVE

## 2023-08-19 LAB — FUNGUS CULTURE W SMEAR
CULTURE:: NO GROWTH
MICRO NUMBER:: 15547594
SMEAR:: NONE SEEN
SPECIMEN QUALITY:: ADEQUATE

## 2023-08-19 LAB — CSF CELL COUNT WITH DIFFERENTIAL
RBC Count, CSF: 1 {cells}/uL — ABNORMAL HIGH
TOTAL NUCLEATED CELL: 0 {cells}/uL (ref 0–5)

## 2023-08-19 LAB — PROTEIN, CSF: Total Protein, CSF: 22 mg/dL (ref 15–45)

## 2023-08-19 LAB — GRAM STAIN
MICRO NUMBER:: 15547593
SPECIMEN QUALITY:: ADEQUATE

## 2023-08-19 LAB — GLUCOSE, CSF: Glucose, CSF: 73 mg/dL (ref 40–80)

## 2023-09-19 ENCOUNTER — Other Ambulatory Visit: Payer: Self-pay | Admitting: Family Medicine

## 2023-09-19 DIAGNOSIS — R2231 Localized swelling, mass and lump, right upper limb: Secondary | ICD-10-CM

## 2023-10-02 ENCOUNTER — Ambulatory Visit
Admission: RE | Admit: 2023-10-02 | Discharge: 2023-10-02 | Disposition: A | Discharge status: Home / Self Care | Payer: Commercial Managed Care - PPO | Source: Ambulatory Visit | Attending: Family Medicine | Admitting: Family Medicine

## 2023-10-02 DIAGNOSIS — R2231 Localized swelling, mass and lump, right upper limb: Secondary | ICD-10-CM

## 2024-01-09 ENCOUNTER — Ambulatory Visit: Payer: Commercial Managed Care - PPO | Admitting: Adult Health

## 2024-01-09 ENCOUNTER — Encounter: Payer: Self-pay | Admitting: Neurology

## 2024-01-09 ENCOUNTER — Ambulatory Visit: Payer: Commercial Managed Care - PPO | Admitting: Neurology

## 2024-01-09 VITALS — BP 120/68 | HR 60 | Ht 65.5 in | Wt 201.0 lb

## 2024-01-09 DIAGNOSIS — G932 Benign intracranial hypertension: Secondary | ICD-10-CM | POA: Insufficient documentation

## 2024-01-09 DIAGNOSIS — Z9884 Bariatric surgery status: Secondary | ICD-10-CM | POA: Diagnosis not present

## 2024-01-09 DIAGNOSIS — G43709 Chronic migraine without aura, not intractable, without status migrainosus: Secondary | ICD-10-CM

## 2024-01-09 MED ORDER — TOPIRAMATE 25 MG PO TABS: 50.0000 mg | ORAL_TABLET | Freq: Every day | ORAL | 3 refills | Status: AC

## 2024-01-09 MED ORDER — SUMATRIPTAN SUCCINATE 50 MG PO TABS: 50.0000 mg | ORAL_TABLET | ORAL | 11 refills | Status: AC | PRN

## 2024-01-09 NOTE — Progress Notes (Addendum)
 Chief Complaint  Patient presents with   Follow-up    Rm14, alone, empty sella IIH:4/30 days of has and doing much better than before since surgery for gastric bypass      ASSESSMENT AND PLAN  Andrea Scott is a 23 y.o. 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 2024  Lumbar puncture in October 2024 showed opening pressure of 47.5 cm water  History of obesity, status post gastric bypass in October 2024 with rapid weight loss  Refer to ophthalmology to rule out continued intracranial hypertension  If needed, may repeat lumbar puncture, funduscopic examination looks sharp at today's visit  Chronic migraine  Long history also have a history of medication overuse rebound headache  Overall improved,  Continue Topamax  25 mg 2 tablets every night  Imitrex  as needed  Return To Clinic With NP In 6 Months   DIAGNOSTIC DATA (LABS, IMAGING, TESTING) - I reviewed patient records, labs, notes, testing and imaging myself where available. Reviewed ophthalmology evaluation from Mar 03, 2024 by Dr. Marvin Slot office PA Franky Ivanoff, disc margins were sharp bilaterally, no disc hemorrhage, minimal edema superior/inferior, greatly reduced from July 11, 2023, do not have pre-IIH OCT for comparison,  MEDICAL HISTORY:  Andrea Scott, is a 23 year old female, follow-up for idiopathic intracranial hypertension, chronic migraine headache, was seen by Dr. Billy Bue in September 2024 History is obtained from the patient and review of electronic medical records. I personally reviewed pertinent available imaging films in PACS.   PMHx of  Allergy. PCOS Gastric bypass in Oct 2024 at [facility], weight loss of 80 Lb  She had long history of chronic migraine since age 60, typical migraine are lateralized often times behind the left eye, severe pounding headache with light, noise sensitivity, nauseous,  Trigger for migraines: weather changes, stress, sleep  deprivation, menstruation, dehydration.  She was having increased the headache over the years, once or twice each week, also accompanied by rapid weight gain,  MRI of brain w/wo in August 2024:  1. Partially empty sella turcica. Vertical tortuosity of the intraorbital optic nerves. These findings can be seen in the setting of idiopathic intracranial hypertension (pseudotumor cerebri). 2. Otherwise unremarkable MRI appearance of the brain. 3. Mild left sphenoid sinus mucosal thickening.  Had LP in Oct 2024, open pressure 47.5 Cm.  Since lumbar puncture, her headache has much improved, she had gastric bypass October 2024 at [facility] with weight loss of 80 pounds, she overall feeling much better     PHYSICAL EXAM:   Vitals:   01/09/24 0846  BP: 120/68  Pulse: 60  Weight: 201 lb (91.2 kg)  Height: 5' 5.5" (1.664 m)   Not recorded     Body mass index is 32.94 kg/m.  PHYSICAL EXAMNIATION:  Gen: NAD, conversant, well nourised, well groomed                     Cardiovascular: Regular rate rhythm, no peripheral edema, warm, nontender. Eyes: Conjunctivae clear without exudates or hemorrhage Neck: Supple, no carotid bruits. Pulmonary: Clear to auscultation bilaterally   NEUROLOGICAL EXAM:  MENTAL STATUS: Speech/cognition: Awake, alert, oriented to history taking and casual conversation CRANIAL NERVES: CN II: Visual fields are full to confrontation. Pupils are round equal and briskly reactive to light.  Funduscopy examination was sharp bilaterally CN III, IV, VI: extraocular movement are normal. No ptosis. CN V: Facial sensation is intact to light touch CN VII: Face is symmetric with normal eye  closure  CN VIII: Hearing is normal to causal conversation. CN IX, X: Phonation is normal. CN XI: Head turning and shoulder shrug are intact  MOTOR: There is no pronator drift of out-stretched arms. Muscle bulk and tone are normal. Muscle strength is normal.  REFLEXES: Reflexes are  2+ and symmetric at the biceps, triceps, knees, and ankles. Plantar responses are flexor.  SENSORY: Intact to light touch, pinprick and vibratory sensation are intact in fingers and toes.  COORDINATION: There is no trunk or limb dysmetria noted.  GAIT/STANCE: Posture is normal. Gait is steady with normal steps, base, arm swing, and turning. Heel and toe walking are normal. Tandem gait is normal.  Romberg is absent.  REVIEW OF SYSTEMS:  Full 14 system review of systems performed and notable only for as above All other review of systems were negative.   ALLERGIES: No Known Allergies  HOME MEDICATIONS: Current Outpatient Medications  Medication Sig Dispense Refill   cetirizine (ZYRTEC) 5 MG tablet Take 5 mg by mouth daily.     Cholecalciferol (VITAMIN D-1000 MAX ST) 25 MCG (1000 UT) tablet 2 tablets Orally Once a day     fluticasone (FLONASE) 50 MCG/ACT nasal spray 2  sprays Nasally Once a day for 30 day(s)     topiramate  (TOPAMAX ) 25 MG tablet Take 25 mg  (1 pill) at bedtime for one week, then increase to 25 mg twice a day for one week, then increase to 25 mg in the morning and 50 mg (2 pills) at bedtime for one week, then increase to 50 mg twice a day 120 tablet 6   No current facility-administered medications for this visit.    PAST MEDICAL HISTORY: Past Medical History:  Diagnosis Date   Migraine    PCOS (polycystic ovarian syndrome)     PAST SURGICAL HISTORY: Past Surgical History:  Procedure Laterality Date   BREAST REDUCTION SURGERY  2019   PERINEAL LACERATION REPAIR N/A 03/02/2018   Procedure: SUTURE REPAIR VAGINAL LACERATION;  Surgeon: Wendelyn Halter, MD;  Location: [facility];  Service: Gynecology;  Laterality: N/A;  repair of vaginal laceration   VULVECTOMY PARTIAL Bilateral 10/05/2015   Procedure: VULVECTOMY PARTIAL;  Surgeon: Ivery Marking, MD;  Location: [facility];  Service: Gynecology;  Laterality: Bilateral;  45 minutes    FAMILY HISTORY: Family History   Problem Relation Age of Onset   High blood pressure Mother    Heart failure Paternal Uncle    Kidney failure Paternal Grandmother     SOCIAL HISTORY: Social History   Socioeconomic History   Marital status: Single    Spouse name: Not on file   Number of children: 0   Years of education: Not on file   Highest education level: Bachelor's degree (e.g., BA, AB, BS)  Occupational History   Occupation: Community education officer  Tobacco Use   Smoking status: Never   Smokeless tobacco: Never  Vaping Use   Vaping status: Never Used  Substance and Sexual Activity   Alcohol use: Yes    Comment: occasional   Drug use: No   Sexual activity: Yes    Birth control/protection: None  Other Topics Concern   Not on file  Social History Narrative   Right handed    Occ soda use    Social Drivers of Health   Financial Resource Strain: Low Risk  (05/06/2023)   Received from [facility]   Overall Financial Resource Strain (CARDIA)    Difficulty of Paying Living Expenses: Not very hard  Food  Insecurity: No Food Insecurity (08/07/2023)   Received from [facility]   Hunger Vital Sign    Worried About Running Out of Food in the Last Year: Never true    Ran Out of Food in the Last Year: Never true  Transportation Needs: No Transportation Needs (08/07/2023)   Received from [facility] - Transportation    Lack of Transportation (Medical): No    Lack of Transportation (Non-Medical): No  Physical Activity: Not on file  Stress: No Stress Concern Present (08/07/2023)   Received from [facility] - Occupational Stress Questionnaire    Feeling of Stress : Not at all  Social Connections: Unknown (05/01/2023)   Received from [facility]   Social Network    Social Network: Not on file  Intimate Partner Violence: Not At Risk (08/07/2023)   Received from [facility]   HITS    Over the last 12 months how often did your partner physically hurt you?: Never     Over the last 12 months how often did your partner insult you or talk down to you?: Never    Over the last 12 months how often did your partner threaten you with physical harm?: Never    Over the last 12 months how often did your partner scream or curse at you?: Never      Phebe Brasil, M.D. Ph.D.  [facility] [address], [address] [place], [place] [address] Ph: [phone] Fax: [phone]  CC:  No referring provider defined for this encounter.  Pcp, No

## 2024-01-20 ENCOUNTER — Telehealth: Payer: Self-pay | Admitting: Neurology

## 2024-01-20 NOTE — Telephone Encounter (Signed)
 Referral for ophthalmology fax to [facility]. Phone: [phone], Fax: [phone]

## 2024-07-23 NOTE — Patient Instructions (Incomplete)

## 2024-07-23 NOTE — Progress Notes (Deleted)
 No chief complaint on file.   HISTORY OF PRESENT ILLNESS:  07/23/24 ALL:  Andrea Scott is a 23 y.o. female here today for follow up for  IIH. She was last seen by Dr onita 12/2023 and doing well on topiramate  50mg  at bedtime. Sumatriptan  used for abortive therapy.   Since,   Eye exam?   HISTORY (copied from Dr Georgianne previous note)   Andrea Scott, is a 23 year old female, follow-up for idiopathic intracranial hypertension, chronic migraine headache, was seen by Dr. Rush in September 2024 History is obtained from the patient and review of electronic medical records. I personally reviewed pertinent available imaging films in PACS.    PMHx of  Allergy. PCOS Gastric bypass in Oct 2024 at [facility], weight loss of 80 Lb   She had long history of chronic migraine since age 48, typical migraine are lateralized often times behind the left eye, severe pounding headache with light, noise sensitivity, nauseous,   Trigger for migraines: weather changes, stress, sleep deprivation, menstruation, dehydration.   She was having increased the headache over the years, once or twice each week, also accompanied by rapid weight gain,   MRI of brain w/wo in August 2024:  1. Partially empty sella turcica. Vertical tortuosity of the intraorbital optic nerves. These findings can be seen in the setting of idiopathic intracranial hypertension (pseudotumor cerebri). 2. Otherwise unremarkable MRI appearance of the brain. 3. Mild left sphenoid sinus mucosal thickening.   Had LP in Oct 2024, open pressure 47.5 Cm.  Since lumbar puncture, her headache has much improved, she had gastric bypass October 2024 at [facility] with weight loss of 80 pounds, she overall feeling much better     REVIEW OF SYSTEMS: Out of a complete 14 system review of symptoms, the patient complains only of the following symptoms, and all other reviewed systems are negative.   ALLERGIES: No Known Allergies   HOME  MEDICATIONS: Outpatient Medications Prior to Visit  Medication Sig Dispense Refill   cetirizine (ZYRTEC) 5 MG tablet Take 5 mg by mouth daily.     Cholecalciferol (VITAMIN D-1000 MAX ST) 25 MCG (1000 UT) tablet 2 tablets Orally Once a day     fluticasone (FLONASE) 50 MCG/ACT nasal spray 2  sprays Nasally Once a day for 30 day(s)     SUMAtriptan  (IMITREX ) 50 MG tablet Take 1 tablet (50 mg total) by mouth every 2 (two) hours as needed for migraine. May repeat in 2 hours if headache persists or recurs. 10 tablet 11   topiramate  (TOPAMAX ) 25 MG tablet Take 2 tablets (50 mg total) by mouth at bedtime. 180 tablet 3   No facility-administered medications prior to visit.     PAST MEDICAL HISTORY: Past Medical History:  Diagnosis Date   Migraine    PCOS (polycystic ovarian syndrome)      PAST SURGICAL HISTORY: Past Surgical History:  Procedure Laterality Date   BREAST REDUCTION SURGERY  2019   PERINEAL LACERATION REPAIR N/A 03/02/2018   Procedure: SUTURE REPAIR VAGINAL LACERATION;  Surgeon: Jayne Vonn DEL, MD;  Location: [facility];  Service: Gynecology;  Laterality: N/A;  repair of vaginal laceration   VULVECTOMY PARTIAL Bilateral 10/05/2015   Procedure: VULVECTOMY PARTIAL;  Surgeon: Dickie Carder, MD;  Location: [facility];  Service: Gynecology;  Laterality: Bilateral;  45 minutes     FAMILY HISTORY: Family History  Problem Relation Age of Onset   High blood pressure Mother    Heart failure Paternal Uncle    Kidney failure  Paternal Grandmother      SOCIAL HISTORY: Social History   Socioeconomic History   Marital status: Single    Spouse name: Not on file   Number of children: 0   Years of education: Not on file   Highest education level: Bachelor's degree (e.g., BA, AB, BS)  Occupational History   Occupation: Community education officer  Tobacco Use   Smoking status: Never   Smokeless tobacco: Never  Vaping Use   Vaping status: Never Used  Substance and Sexual Activity   Alcohol use: Yes     Comment: occasional   Drug use: No   Sexual activity: Yes    Birth control/protection: None  Other Topics Concern   Not on file  Social History Narrative   Right handed    Occ soda use    Social Drivers of Health   Financial Resource Strain: Low Risk  (05/06/2023)   Received from [facility]   Overall Financial Resource Strain (CARDIA)    Difficulty of Paying Living Expenses: Not very hard  Food Insecurity: No Food Insecurity (08/07/2023)   Received from [facility]   Hunger Vital Sign    Within the past 12 months, you worried that your food would run out before you got the money to buy more.: Never true    Within the past 12 months, the food you bought just didn't last and you didn't have money to get more.: Never true  Transportation Needs: No Transportation Needs (08/07/2023)   Received from [facility] - Transportation    Lack of Transportation (Medical): No    Lack of Transportation (Non-Medical): No  Physical Activity: Not on file  Stress: No Stress Concern Present (08/07/2023)   Received from [facility] - Occupational Stress Questionnaire    Feeling of Stress : Not at all  Social Connections: Unknown (05/01/2023)   Received from [facility]   Social Network    Social Network: Not on file  Intimate Partner Violence: Not At Risk (08/07/2023)   Received from [facility]   HITS    Over the last 12 months how often did your partner physically hurt you?: Never    Over the last 12 months how often did your partner insult you or talk down to you?: Never    Over the last 12 months how often did your partner threaten you with physical harm?: Never    Over the last 12 months how often did your partner scream or curse at you?: Never     PHYSICAL EXAM  There were no vitals filed for this visit. There is no height or weight on file to calculate BMI.  Generalized: Well developed, in no acute  distress  Cardiology: normal rate and rhythm, no murmur auscultated  Respiratory: clear to auscultation bilaterally    Neurological examination  Mentation: Alert oriented to time, place, history taking. Follows all commands speech and language fluent Cranial nerve II-XII: Pupils were equal round reactive to light. Extraocular movements were full, visual field were full on confrontational test. Facial sensation and strength were normal. Uvula tongue midline. Head turning and shoulder shrug  were normal and symmetric. Motor: The motor testing reveals 5 over 5 strength of all 4 extremities. Good symmetric motor tone is noted throughout.  Sensory: Sensory testing is intact to soft touch on all 4 extremities. No evidence of extinction is noted.  Coordination: Cerebellar testing reveals good finger-nose-finger and heel-to-shin bilaterally.  Gait and station:  Gait is normal. Tandem gait is normal. Romberg is negative. No drift is seen.  Reflexes: Deep tendon reflexes are symmetric and normal bilaterally.    DIAGNOSTIC DATA (LABS, IMAGING, TESTING) - I reviewed patient records, labs, notes, testing and imaging myself where available.  Lab Results  Component Value Date   WBC 5.4 07/12/2022   HGB 12.6 07/12/2022   HCT 38.4 07/12/2022   MCV 88.5 07/12/2022   PLT 313 07/12/2022      Component Value Date/Time   NA 136 07/12/2022 2322   K 3.7 07/12/2022 2322   CL 102 07/12/2022 2322   CO2 24 07/12/2022 2322   GLUCOSE 98 07/12/2022 2322   BUN 10 07/12/2022 2322   CREATININE 1.01 (H) 07/12/2022 2322   CALCIUM 9.0 07/12/2022 2322   PROT 7.2 02/12/2012 2127   ALBUMIN 3.7 02/12/2012 2127   AST 17 02/12/2012 2127   ALT 10 02/12/2012 2127   ALKPHOS 120 02/12/2012 2127   BILITOT 0.2 (L) 02/12/2012 2127   GFRNONAA >60 07/12/2022 2322   GFRAA NOT CALCULATED 02/12/2012 2127   No results found for: CHOL, HDL, LDLCALC, LDLDIRECT, TRIG, CHOLHDL No results found for: YHAJ8R No results  found for: VITAMINB12 No results found for: TSH      No data to display               No data to display           ASSESSMENT AND PLAN  23 y.o. year old female  has a past medical history of Migraine and PCOS (polycystic ovarian syndrome). here with    No diagnosis found.  Rachel Limes ***.  Healthy lifestyle habits encouraged. *** will follow up with PCP as directed. *** will return to see me in ***, sooner if needed. *** verbalizes understanding and agreement with this plan.   No orders of the defined types were placed in this encounter.    No orders of the defined types were placed in this encounter.    Greig Forbes, MSN, FNP-C 07/23/2024, 11:34 AM  [facility] [address] [place], [place] [address] [phone]

## 2024-07-27 ENCOUNTER — Ambulatory Visit: Admitting: Family Medicine

## 2024-07-27 ENCOUNTER — Encounter: Payer: Self-pay | Admitting: Family Medicine

## 2024-07-27 VITALS — BP 118/75 | HR 61 | Ht 65.0 in | Wt 196.5 lb

## 2024-07-27 DIAGNOSIS — E236 Other disorders of pituitary gland: Secondary | ICD-10-CM | POA: Diagnosis not present

## 2024-07-27 DIAGNOSIS — G43709 Chronic migraine without aura, not intractable, without status migrainosus: Secondary | ICD-10-CM | POA: Diagnosis not present

## 2024-07-27 DIAGNOSIS — Z9884 Bariatric surgery status: Secondary | ICD-10-CM

## 2024-07-27 DIAGNOSIS — G932 Benign intracranial hypertension: Secondary | ICD-10-CM | POA: Diagnosis not present

## 2024-07-27 NOTE — Patient Instructions (Signed)
 Below is our plan:  We will continue topiramate  50mg  at bedtime. Use sumatriptan  as needed  Please make sure you are staying well hydrated. I recommend 50-60 ounces daily. Well balanced diet and regular exercise encouraged. Consistent sleep schedule with 6-8 hours recommended.   Please continue follow up with care team as directed.   Follow up with me in 1 year   You may receive a survey regarding today's visit. I encourage you to leave honest feed back as I do use this information to improve patient care. Thank you for seeing me today!   GENERAL HEADACHE INFORMATION:   Natural supplements: Magnesium Oxide or Magnesium Glycinate 500 mg at bed (up to 800 mg daily) Coenzyme Q10 300 mg in AM Vitamin B2- 200 mg twice a day   Add 1 supplement at a time since even natural supplements can have undesirable side effects. You can sometimes buy supplements cheaper (especially Coenzyme Q10) at www.WebmailGuide.co.za or at [facility].  Migraine with aura: There is increased risk for stroke in women with migraine with aura and a contraindication for the combined contraceptive pill for use by women who have migraine with aura. The risk for women with migraine without aura is lower. However other risk factors like smoking are far more likely to increase stroke risk than migraine. There is a recommendation for no smoking and for the use of OCPs without estrogen such as progestogen only pills particularly for women with migraine with aura.SABRA People who have migraine headaches with auras may be 3 times more likely to have a stroke caused by a blood clot, compared to migraine patients who don't see auras. Women who take hormone-replacement therapy may be 30 percent more likely to suffer a clot-based stroke than women not taking medication containing estrogen. Other risk factors like smoking and high blood pressure may be  much more important.    Vitamins and herbs that show potential:   Magnesium: Magnesium (250 mg twice a  day or 500 mg at bed) has a relaxant effect on smooth muscles such as blood vessels. Individuals suffering from frequent or daily headache usually have low magnesium levels which can be increase with daily supplementation of 400-750 mg. Three trials found 40-90% average headache reduction  when used as a preventative. Magnesium may help with headaches are aura, the best evidence for magnesium is for migraine with aura is its thought to stop the cortical spreading depression we believe is the pathophysiology of migraine aura.Magnesium also demonstrated the benefit in menstrually related migraine.  Magnesium is part of the messenger system in the serotonin cascade and it is a good muscle relaxant.  It is also useful for constipation which can be a side effect of other medications used to treat migraine. Good sources include nuts, whole grains, and tomatoes. Side Effects: loose stool/diarrhea  Riboflavin (vitamin B 2) 200 mg twice a day. This vitamin assists nerve cells in the production of ATP a principal energy storing molecule.  It is necessary for many chemical reactions in the body.  There have been at least 3 clinical trials of riboflavin using 400 mg per day all of which suggested that migraine frequency can be decreased.  All 3 trials showed significant improvement in over half of migraine sufferers.  The supplement is found in bread, cereal, milk, meat, and poultry.  Most Americans get more riboflavin than the recommended daily allowance, however riboflavin deficiency is not necessary for the supplements to help prevent headache. Side effects: energizing, green urine  Coenzyme Q10: This is present in almost all cells in the body and is critical component for the conversion of energy.  Recent studies have shown that a nutritional supplement of CoQ10 can reduce the frequency of migraine attacks by improving the energy production of cells as with riboflavin.  Doses of 150 mg twice a day have been shown to be  effective.   Melatonin: Increasing evidence shows correlation between melatonin secretion and headache conditions.  Melatonin supplementation has decreased headache intensity and duration.  It is widely used as a sleep aid.  Sleep is natures way of dealing with migraine.  A dose of 3 mg is recommended to start for headaches including cluster headache. Higher doses up to 15 mg has been reviewed for use in Cluster headache and have been used. The rationale behind using melatonin for cluster is that many theories regarding the cause of Cluster headache center around the disruption of the normal circadian rhythm in the brain.  This helps restore the normal circadian rhythm.   HEADACHE DIET: Foods and beverages which may trigger migraine Note that only 20% of headache patients are food sensitive. You will know if you are food sensitive if you get a headache consistently 20 minutes to 2 hours after eating a certain food. Only cut out a food if it causes headaches, otherwise you might remove foods you enjoy! What matters most for diet is to eat a well balanced healthy diet full of vegetables and low fat protein, and to not miss meals.   Chocolate, other sweets ALL cheeses except cottage and cream cheese Dairy products, yogurt, sour cream, ice cream Liver Meat extracts (Bovril, Marmite, meat tenderizers) Meats or fish which have undergone aging, fermenting, pickling or smoking. These include: Hotdogs,salami,Lox,sausage, mortadellas,smoked salmon, pepperoni, Pickled herring Pods of broad bean (English beans, Chinese pea pods, [place] (fava) beans, lima and navy beans Ripe avocado, ripe banana Yeast extracts or active yeast preparations such as Brewer's or Fleishman's (commercial bakes goods are permitted) Tomato based foods, pizza (lasagna, etc.)   MSG (monosodium glutamate) is disguised as many things; look for these common aliases: Monopotassium glutamate Autolysed yeast Hydrolysed protein Sodium  caseinate "flavorings" "all natural preservatives Nutrasweet   Avoid all other foods that convincingly provoke headaches.   Resources: The Dizzy Bluford Aid Your Headache Diet, migrainestrong.com  https://zamora-andrews.com/   Caffeine and Migraine For patients that have migraine, caffeine intake more than 3 days per week can lead to dependency and increased migraine frequency. I would recommend cutting back on your caffeine intake as best you can. The recommended amount of caffeine is 200-300 mg daily, although migraine patients may experience dependency at even lower doses. While you may notice an increase in headache temporarily, cutting back will be helpful for headaches in the long run. For more information on caffeine and migraine, visit: https://americanmigrainefoundation.org/resource-library/caffeine-and-migraine/   Headache Prevention Strategies:   1. Maintain a headache diary; learn to identify and avoid triggers.  - This can be a simple note where you log when you had a headache, associated symptoms, and medications used - There are several smartphone apps developed to help track migraines: Migraine Buddy, Migraine Monitor, Curelator N1-Headache App   Common triggers include: Emotional triggers: Emotional/Upset family or friends Emotional/Upset occupation Business reversal/success Anticipation anxiety Crisis-serious Post-crisis periodNew job/position   Physical triggers: Vacation Day Weekend Strenuous Exercise High Altitude Location New Move Menstrual Day Physical Illness Oversleep/Not enough sleep Weather changes Light: Photophobia or light sesnitivity treatment involves a balance between desensitization and reduction  in overly strong input. Use dark polarized glasses outside, but not inside. Avoid bright or fluorescent light, but do not dim environment to the point that going into a normally lit room hurts. Consider  FL-41 tint lenses, which reduce the most irritating wavelengths without blocking too much light.  These can be obtained at axonoptics.com or theraspecs.com Foods: see list above.   2. Limit use of acute treatments (over-the-counter medications, triptans, etc.) to no more than 2 days per week or 10 days per month to prevent medication overuse headache (rebound headache).     3. Follow a regular schedule (including weekends and holidays): Don't skip meals. Eat a balanced diet. 8 hours of sleep nightly. Minimize stress. Exercise 30 minutes per day. Being overweight is associated with a 5 times increased risk of chronic migraine. Keep well hydrated and drink 6-8 glasses of water per day.   4. Initiate non-pharmacologic measures at the earliest onset of your headache. Rest and quiet environment. Relax and reduce stress. Breathe2Relax is a free app that can instruct you on    some simple relaxtion and breathing techniques. Http://Dawnbuse.com is a    free website that provides teaching videos on relaxation.  Also, there are  many apps that   can be downloaded for "mindful" relaxation.  An app called YOGA NIDRA will help walk you through mindfulness. Another app called Calm can be downloaded to give you a structured mindfulness guide with daily reminders and skill development. Headspace for guided meditation Mindfulness Based Stress Reduction Online Course: www.palousemindfulness.com Cold compresses.   5. Don't wait!! Take the maximum allowable dosage of prescribed medication at the first sign of migraine.   6. Compliance:  Take prescribed medication regularly as directed and at the first sign of a migraine.   7. Communicate:  Call your physician when problems arise, especially if your headaches change, increase in frequency/severity, or become associated with neurological symptoms (weakness, numbness, slurred speech, etc.). Proceed to emergency room if you experience new or worsening symptoms or  symptoms do not resolve, if you have new neurologic symptoms or if headache is severe, or for any concerning symptom.   8. Headache/pain management therapies: Consider various complementary methods, including medication, behavioral therapy, psychological counselling, biofeedback, massage therapy, acupuncture, dry needling, and other modalities.  Such measures may reduce the need for medications. Counseling for pain management, where patients learn to function and ignore/minimize their pain, seems to work very well.   9. Recommend changing family's attention and focus away from patient's headaches. Instead, emphasize daily activities. If first question of day is 'How are your headaches/Do you have a headache today?', then patient will constantly think about headaches, thus making them worse. Goal is to re-direct attention away from headaches, toward daily activities and other distractions.   10. Helpful Websites: www.AmericanHeadacheSociety.org PatentHood.ch www.headaches.org TightMarket.nl www.achenet.org

## 2024-07-27 NOTE — Progress Notes (Signed)
 Chief Complaint  Patient presents with   Follow-up    Pt in room 1.alone.  Here for IIH follow up.    HISTORY OF PRESENT ILLNESS:  07/27/24 ALL:  Andrea Scott is a 23 y.o. female here today for follow up for  IIH. She was last seen by Dr Onita 12/2023 and doing well on topiramate  50mg  at bedtime. Sumatriptan  used for abortive therapy.   Since, she reports doing fairly well. She admits that she has not been as consistent with taking topriamate every night. She misses 3 days a week, on average. She tolerates it well. She reports having more migrainous headaches. Averaging about 3-4 a week. She has not used sumatriptan .   Last eye exam with Dr Octavia was last week for conjunctival hemorrhage. Ocular pressure normal and no disc edema per pt. She has lost about 95lbs since bariatric surgery 07/2023. She has annual follow up next week.    HISTORY (copied from Dr Georgianne previous note)   Andrea Scott, is a 23 year old female, follow-up for idiopathic intracranial hypertension, chronic migraine headache, was seen by Dr. Rush in September 2024 History is obtained from the patient and review of electronic medical records. I personally reviewed pertinent available imaging films in PACS.    PMHx of  Allergy. PCOS Gastric bypass in Oct 2024 at [facility], weight loss of 80 Lb   She had long history of chronic migraine since age 30, typical migraine are lateralized often times behind the left eye, severe pounding headache with light, noise sensitivity, nauseous,   Trigger for migraines: weather changes, stress, sleep deprivation, menstruation, dehydration.   She was having increased the headache over the years, once or twice each week, also accompanied by rapid weight gain,   MRI of brain w/wo in August 2024:  1. Partially empty sella turcica. Vertical tortuosity of the intraorbital optic nerves. These findings can be seen in the setting of idiopathic intracranial hypertension (pseudotumor  cerebri). 2. Otherwise unremarkable MRI appearance of the brain. 3. Mild left sphenoid sinus mucosal thickening.   Had LP in Oct 2024, open pressure 47.5 Cm.  Since lumbar puncture, her headache has much improved, she had gastric bypass October 2024 at [facility] with weight loss of 80 pounds, she overall feeling much better     REVIEW OF SYSTEMS: Out of a complete 14 system review of symptoms, the patient complains only of the following symptoms, headaches and all other reviewed systems are negative.   ALLERGIES: No Known Allergies   HOME MEDICATIONS: Outpatient Medications Prior to Visit  Medication Sig Dispense Refill   cetirizine (ZYRTEC) 5 MG tablet Take 5 mg by mouth daily.     Cholecalciferol (VITAMIN D-1000 MAX ST) 25 MCG (1000 UT) tablet 2 tablets Orally Once a day     fluticasone (FLONASE) 50 MCG/ACT nasal spray 2  sprays Nasally Once a day for 30 day(s)     topiramate  (TOPAMAX ) 25 MG tablet Take 2 tablets (50 mg total) by mouth at bedtime. 180 tablet 3   SUMAtriptan  (IMITREX ) 50 MG tablet Take 1 tablet (50 mg total) by mouth every 2 (two) hours as needed for migraine. May repeat in 2 hours if headache persists or recurs. (Patient not taking: Reported on 07/27/2024) 10 tablet 11   No facility-administered medications prior to visit.     PAST MEDICAL HISTORY: Past Medical History:  Diagnosis Date   Migraine    PCOS (polycystic ovarian syndrome)      PAST SURGICAL HISTORY: Past Surgical  History:  Procedure Laterality Date   BREAST REDUCTION SURGERY  2019   PERINEAL LACERATION REPAIR N/A 03/02/2018   Procedure: SUTURE REPAIR VAGINAL LACERATION;  Surgeon: Jayne Vonn DEL, MD;  Location: [facility];  Service: Gynecology;  Laterality: N/A;  repair of vaginal laceration   VULVECTOMY PARTIAL Bilateral 10/05/2015   Procedure: VULVECTOMY PARTIAL;  Surgeon: Dickie Carder, MD;  Location: [facility];  Service: Gynecology;  Laterality: Bilateral;  45 minutes     FAMILY  HISTORY: Family History  Problem Relation Age of Onset   High blood pressure Mother    Heart failure Paternal Uncle    Kidney failure Paternal Grandmother      SOCIAL HISTORY: Social History   Socioeconomic History   Marital status: Single    Spouse name: Not on file   Number of children: 0   Years of education: Not on file   Highest education level: Bachelor's degree (e.g., BA, AB, BS)  Occupational History   Occupation: Community education officer  Tobacco Use   Smoking status: Never   Smokeless tobacco: Never  Vaping Use   Vaping status: Never Used  Substance and Sexual Activity   Alcohol use: Yes    Comment: occasional   Drug use: No   Sexual activity: Yes    Birth control/protection: None  Other Topics Concern   Not on file  Social History Narrative   Right handed    Occ soda use    Social Drivers of Health   Financial Resource Strain: Low Risk  (05/06/2023)   Received from [facility]   Overall Financial Resource Strain (CARDIA)    Difficulty of Paying Living Expenses: Not very hard  Food Insecurity: No Food Insecurity (08/07/2023)   Received from [facility]   Hunger Vital Sign    Within the past 12 months, you worried that your food would run out before you got the money to buy more.: Never true    Within the past 12 months, the food you bought just didn't last and you didn't have money to get more.: Never true  Transportation Needs: No Transportation Needs (08/07/2023)   Received from [facility] - Transportation    Lack of Transportation (Medical): No    Lack of Transportation (Non-Medical): No  Physical Activity: Not on file  Stress: No Stress Concern Present (08/07/2023)   Received from [facility] - Occupational Stress Questionnaire    Feeling of Stress : Not at all  Social Connections: Unknown (05/01/2023)   Received from [facility]   Social Network    Social Network: Not on file  Intimate Partner  Violence: Not At Risk (08/07/2023)   Received from [facility]   HITS    Over the last 12 months how often did your partner physically hurt you?: Never    Over the last 12 months how often did your partner insult you or talk down to you?: Never    Over the last 12 months how often did your partner threaten you with physical harm?: Never    Over the last 12 months how often did your partner scream or curse at you?: Never     PHYSICAL EXAM  Vitals:   07/27/24 1258  BP: 118/75  Pulse: 61  SpO2: 98%  Weight: 196 lb 8 oz (89.1 kg)  Height: 5' 5 (1.651 m)   Body mass index is 32.7 kg/m.  Generalized: Well developed, in no acute distress  Cardiology: normal rate  and rhythm, no murmur auscultated  Respiratory: clear to auscultation bilaterally    Neurological examination  Mentation: Alert oriented to time, place, history taking. Follows all commands speech and language fluent Cranial nerve II-XII: Pupils were equal round reactive to light. Extraocular movements were full, visual field were full on confrontational test. Facial sensation and strength were normal. Uvula tongue midline. Head turning and shoulder shrug  were normal and symmetric. Motor: The motor testing reveals 5 over 5 strength of all 4 extremities. Good symmetric motor tone is noted throughout.  Gait and station: Gait is normal.   DIAGNOSTIC DATA (LABS, IMAGING, TESTING) - I reviewed patient records, labs, notes, testing and imaging myself where available.  Lab Results  Component Value Date   WBC 5.4 07/12/2022   HGB 12.6 07/12/2022   HCT 38.4 07/12/2022   MCV 88.5 07/12/2022   PLT 313 07/12/2022      Component Value Date/Time   NA 136 07/12/2022 2322   K 3.7 07/12/2022 2322   CL 102 07/12/2022 2322   CO2 24 07/12/2022 2322   GLUCOSE 98 07/12/2022 2322   BUN 10 07/12/2022 2322   CREATININE 1.01 (H) 07/12/2022 2322   CALCIUM 9.0 07/12/2022 2322   PROT 7.2 02/12/2012 2127   ALBUMIN 3.7 02/12/2012 2127    AST 17 02/12/2012 2127   ALT 10 02/12/2012 2127   ALKPHOS 120 02/12/2012 2127   BILITOT 0.2 (L) 02/12/2012 2127   GFRNONAA >60 07/12/2022 2322   GFRAA NOT CALCULATED 02/12/2012 2127   No results found for: CHOL, HDL, LDLCALC, LDLDIRECT, TRIG, CHOLHDL No results found for: YHAJ8R No results found for: VITAMINB12 No results found for: TSH      No data to display               No data to display           ASSESSMENT AND PLAN  23 y.o. year old female  has a past medical history of Migraine and PCOS (polycystic ovarian syndrome). here with    IIH (idiopathic intracranial hypertension)  Chronic migraine w/o aura w/o status migrainosus, not intractable  History of gastric bypass  Empty sella  Andrea Scott reports doing fairly well from IIH standpoint. She has had more frequent migraines. I have encouraged her to take topiramate  50mg , consistently, at bedtime. She may use sumatriptan  as needed. Advised to avoid using Tylenol  more than 1-2 times a week. Continue close follow up with ophthalmology. Continue weight management program. Healthy lifestyle habits encouraged. She will follow up with PCP as directed. She will return to see me in 1 year, sooner if needed. She verbalizes understanding and agreement with this plan.   No orders of the defined types were placed in this encounter.    No orders of the defined types were placed in this encounter.    Greig Forbes, MSN, FNP-C 07/27/2024, 1:40 PM  [facility] [address] [place], [place] [address] [phone]

## 2024-09-08 ENCOUNTER — Ambulatory Visit: Admitting: Family Medicine

## 2024-11-02 ENCOUNTER — Ambulatory Visit (HOSPITAL_COMMUNITY)
Admission: EM | Admit: 2024-11-02 | Discharge: 2024-11-02 | Disposition: A | Discharge status: Home / Self Care | Attending: Nurse Practitioner | Admitting: Nurse Practitioner

## 2024-11-02 ENCOUNTER — Encounter (HOSPITAL_COMMUNITY): Payer: Self-pay

## 2024-11-02 DIAGNOSIS — J358 Other chronic diseases of tonsils and adenoids: Secondary | ICD-10-CM | POA: Insufficient documentation

## 2024-11-02 DIAGNOSIS — N898 Other specified noninflammatory disorders of vagina: Secondary | ICD-10-CM | POA: Insufficient documentation

## 2024-11-02 LAB — POCT URINE PREGNANCY: Preg Test, Ur: NEGATIVE

## 2024-11-02 LAB — POCT RAPID STREP A (OFFICE): Rapid Strep A Screen: NEGATIVE

## 2024-11-02 MED ORDER — METRONIDAZOLE 500 MG PO TABS: 500.0000 mg | ORAL_TABLET | Freq: Two times a day (BID) | ORAL | 0 refills | Status: AC

## 2024-11-02 MED ORDER — FLUCONAZOLE 150 MG PO TABS: 150.0000 mg | ORAL_TABLET | ORAL | 0 refills | Status: AC

## 2024-11-02 NOTE — ED Provider Notes (Signed)
 " [facility]    CSN: [id number] Arrival date & time: 11/02/24  1222      History   Chief Complaint Chief Complaint  Patient presents with   tonsil white patches   Vaginal Discharge    HPI Andrea Scott is a 24 y.o. female.   Discussed the use of AI scribe software for clinical note transcription with the patient, who gave verbal consent to proceed.   Andrea Scott presents with white patches noticed on her throat this morning. She denies throat pain, soreness, swallowing difficulties, or recent illness symptoms including runny nose, congestion, cough, or fever. She reports getting sick more frequently over the past year but was last sick right before Christmas.  Three weeks ago, she was treated with Diflucan  by her OBGYN for a presumed yeast infection based on visual white discharge without testing. Despite treatment, she continued to see white discharge and self-treated with leftover vaginal metronidazole  for two nights with continued discharge noted today. She had complete STD screening which included blood work on December 7th that was all negative. She reports having intercourse on Friday (3 days ago) and has had two female partner in the past three months, using condoms sometimes. She is not on birth control. She denies vaginal itching, irritation, odor, or burning with urination.  Her last menstrual period was estimated around December 15th, though her periods are irregular and she has not had one this month.   The following sections of the patient's history were reviewed and updated as appropriate: allergies, current medications, past family history, past medical history, past social history, past surgical history, and problem list.      Past Medical History:  Diagnosis Date   Migraine    PCOS (polycystic ovarian syndrome)     Patient Active Problem List   Diagnosis Date Noted   Chronic migraine w/o aura w/o status migrainosus, not intractable 01/09/2024   IIH  (idiopathic intracranial hypertension) 01/09/2024   History of gastric bypass 01/09/2024   Non-obstetric vaginal laceration without foreign body or perineal laceration     Past Surgical History:  Procedure Laterality Date   BREAST REDUCTION SURGERY  2019   PERINEAL LACERATION REPAIR N/A 03/02/2018   Procedure: SUTURE REPAIR VAGINAL LACERATION;  Surgeon: Jayne Vonn DEL, MD;  Location: [facility];  Service: Gynecology;  Laterality: N/A;  repair of vaginal laceration   VULVECTOMY PARTIAL Bilateral 10/05/2015   Procedure: VULVECTOMY PARTIAL;  Surgeon: Dickie Carder, MD;  Location: [facility];  Service: Gynecology;  Laterality: Bilateral;  45 minutes    OB History   No obstetric history on file.      Home Medications    Prior to Admission medications  Medication Sig Start Date End Date Taking? Authorizing Provider  fluconazole  (DIFLUCAN ) 150 MG tablet Take 1 tablet (150 mg total) by mouth every 3 (three) days for 2 doses. 11/02/24 11/06/24 Yes Ervie Mccard, FNP  metroNIDAZOLE  (FLAGYL ) 500 MG tablet Take 1 tablet (500 mg total) by mouth 2 (two) times daily. 11/02/24  Yes Iola Lukes, FNP  cetirizine (ZYRTEC) 5 MG tablet Take 5 mg by mouth daily.    [provider]  Cholecalciferol (VITAMIN D-1000 MAX ST) 25 MCG (1000 UT) tablet 2 tablets Orally Once a day    [provider]  fluticasone (FLONASE) 50 MCG/ACT nasal spray 2  sprays Nasally Once a day for 30 day(s) 07/02/12   [provider]  SUMAtriptan  (IMITREX ) 50 MG tablet Take 1 tablet (50 mg total) by mouth every 2 (  two) hours as needed for migraine. May repeat in 2 hours if headache persists or recurs. Patient not taking: Reported on 07/27/2024 01/09/24   Onita Duos, MD  topiramate  (TOPAMAX ) 25 MG tablet Take 2 tablets (50 mg total) by mouth at bedtime. 01/09/24   Onita Duos, MD    Family History Family History  Problem Relation Age of Onset   High blood pressure Mother    Heart failure Paternal Uncle     Kidney failure Paternal Grandmother     Social History Social History[1]   Allergies   Patient has no known allergies.   Review of Systems Review of Systems  Constitutional:  Negative for fever.  HENT:  Negative for congestion, postnasal drip, sneezing, sore throat (white spots on tonsils) and trouble swallowing.   Respiratory:  Negative for cough.   Gastrointestinal:  Negative for abdominal pain, nausea and vomiting.  Genitourinary:  Positive for vaginal discharge. Negative for dysuria, genital sores and menstrual problem (LMP maybe around 12/15; hx irregular periods).       No odor, itching or irritation.   All other systems reviewed and are negative.    Physical Exam Triage Vital Signs ED Triage Vitals  Encounter Vitals Group     BP 11/02/24 1525 115/74     Girls Systolic BP Percentile --      Girls Diastolic BP Percentile --      Boys Systolic BP Percentile --      Boys Diastolic BP Percentile --      Pulse Rate 11/02/24 1525 (!) 59     Resp 11/02/24 1525 16     Temp 11/02/24 1525 97.9 F (36.6 C)     Temp Source 11/02/24 1525 Oral     SpO2 11/02/24 1525 98 %     Weight --      Height --      Head Circumference --      Peak Flow --      Pain Score 11/02/24 1524 0     Pain Loc --      Pain Education --      Exclude from Growth Chart --    No data found.  Updated Vital Signs BP 115/74 (BP Location: Left Arm)   Pulse (!) 59   Temp 97.9 F (36.6 C) (Oral)   Resp 16   LMP 09/28/2024 (Approximate)   SpO2 98%   Visual Acuity Right Eye Distance:   Left Eye Distance:   Bilateral Distance:    Right Eye Near:   Left Eye Near:    Bilateral Near:     Physical Exam Constitutional:      General: She is not in acute distress.    Appearance: Normal appearance. She is not ill-appearing, toxic-appearing or diaphoretic.  HENT:     Head: Normocephalic.     Nose: Nose normal.     Mouth/Throat:     Mouth: Mucous membranes are moist.     Pharynx: Uvula  midline. Pharyngeal swelling, oropharyngeal exudate (mostly right sided) and posterior oropharyngeal erythema present. No uvula swelling or postnasal drip.     Tonsils: No tonsillar abscesses.  Eyes:     Conjunctiva/sclera: Conjunctivae normal.  Cardiovascular:     Rate and Rhythm: Normal rate.  Pulmonary:     Effort: Pulmonary effort is normal.  Abdominal:     Palpations: Abdomen is soft.  Genitourinary:    Comments: Deferred; patient performed self-swab for Aptima testing  Musculoskeletal:  General: Normal range of motion.     Cervical back: Normal range of motion and neck supple.  Lymphadenopathy:     Cervical: Cervical adenopathy present.  Skin:    General: Skin is warm and dry.  Neurological:     General: No focal deficit present.     Mental Status: She is alert and oriented to person, place, and time.  Psychiatric:        Mood and Affect: Mood normal.        Behavior: Behavior normal.      UC Treatments / Results  Labs (all labs ordered are listed, but only abnormal results are displayed) Labs Reviewed  CULTURE, GROUP A STREP [facility])  POCT URINE PREGNANCY  POCT RAPID STREP A (OFFICE)  CERVICOVAGINAL ANCILLARY ONLY  CYTOLOGY, (ORAL, ANAL, URETHRAL) ANCILLARY ONLY    EKG   Radiology No results found.  Procedures Procedures (including critical care time)  Medications Ordered in UC Medications - No data to display  Initial Impression / Assessment and Plan / UC Course  I have reviewed the triage vital signs and the nursing notes.  Pertinent labs & imaging results that were available during my care of the patient were reviewed by me and considered in my medical decision making (see chart for details).    Patient presents with two primary concerns: acute tonsillar exudate and persistent vaginal discharge. She reports noticing white patches in her throat this morning. Physical examination reveals exudate and swelling of the right tonsil. Rapid strep  testing was negative. Given the acute onset and exam findings, the differential includes viral tonsillitis, non-Group A streptococcal bacterial tonsillitis, or other infectious etiologies. A throat culture was obtained to confirm the negative rapid strep result, and oral cytology was ordered to further evaluate for other infectious causes. Supportive care was recommended, including warm saltwater gargles, a soft diet, avoidance of hard, crunchy, or spicy foods, and over-the-counter analgesics as needed for pain.  The patient also reports persistent white vaginal discharge despite treatment with fluconazole  prescribed by her OB-GYN three weeks ago and self-treatment with leftover vaginal medication without improvement. She reports recent unprotected intercourse three nights ago and has had two female sexual partners in the past three months with inconsistent condom use. Prior STI screening in December was negative. A self-collected vaginal swab was obtained for testing including trichomonas, chlamydia, and gonorrhea. Empiric treatment was initiated with metronidazole  and fluconazole  for suspected bacterial vaginosis and yeast infection. Results will be available via MyChart, and the patient will be contacted by phone if results require modification of treatment.  Patient was advised to follow up with primary care or OB-GYN for persistent or recurrent symptoms. Emergency evaluation was advised for worsening throat swelling, difficulty swallowing or breathing, high fever, severe pelvic or abdominal pain, heavy vaginal bleeding, or other concerning symptoms.  Today's evaluation has revealed no signs of a dangerous process. Discussed diagnosis with patient and/or guardian. Patient and/or guardian aware of their diagnosis, possible red flag symptoms to watch out for and need for close follow up. Patient and/or guardian understands verbal and written discharge instructions. Patient and/or guardian comfortable with plan  and disposition.  Patient and/or guardian has a clear mental status at this time, good insight into illness (after discussion and teaching) and has clear judgment to make decisions regarding their care  Documentation was completed with the aid of voice recognition software. Transcription may contain typographical errors.   Final Clinical Impressions(s) / UC Diagnoses   Final diagnoses:  Vaginal discharge  Tonsillar exudate     Discharge Instructions      You were seen today for white patches on your throat and ongoing vaginal discharge. The exam showed swelling and white material on your right tonsil. Your rapid strep test was negative, and additional throat testing was sent to confirm results and check for other possible infections. In the meantime, this may be caused by a viral or non-strep bacterial infection. To help with any throat discomfort, try warm saltwater gargles, eat soft foods, and avoid spicy, crunchy, or hard foods. You may use over-the-counter pain relievers such as acetaminophen  or ibuprofen  as needed.  You also reported persistent white vaginal discharge despite recent treatment. Because of your symptoms and recent sexual activity, testing was collected today for common infections, and treatment was started for possible bacterial vaginosis and yeast infection. Take the medications exactly as prescribed and avoid alcohol while taking metronidazole . Test results will be available in MyChart, and you will be contacted if any changes to treatment are needed. Avoid sexual activity until treatment is completed and symptoms have resolved.  Follow up with your primary care provider or OB-GYN if symptoms do not improve after completing treatment or if they return. Go to the emergency department right away if you develop trouble breathing or swallowing, worsening throat swelling, high fever, severe pelvic or abdominal pain, heavy vaginal bleeding, vomiting, or any other new or concerning  symptoms.     ED Prescriptions     Medication Sig Dispense Auth. Provider   metroNIDAZOLE  (FLAGYL ) 500 MG tablet Take 1 tablet (500 mg total) by mouth 2 (two) times daily. 14 tablet Iola Lukes, FNP   fluconazole  (DIFLUCAN ) 150 MG tablet Take 1 tablet (150 mg total) by mouth every 3 (three) days for 2 doses. 2 tablet Iola Lukes, FNP      PDMP not reviewed this encounter.     [1]  Social History Tobacco Use   Smoking status: Never   Smokeless tobacco: Never  Vaping Use   Vaping status: Never Used  Substance Use Topics   Alcohol use: Yes    Comment: occasional   Drug use: No     Iola Lukes, FNP 11/02/24 1734  "

## 2024-11-02 NOTE — Discharge Instructions (Addendum)
 You were seen today for white patches on your throat and ongoing vaginal discharge. The exam showed swelling and white material on your right tonsil. Your rapid strep test was negative, and additional throat testing was sent to confirm results and check for other possible infections. In the meantime, this may be caused by a viral or non-strep bacterial infection. To help with any throat discomfort, try warm saltwater gargles, eat soft foods, and avoid spicy, crunchy, or hard foods. You may use over-the-counter pain relievers such as acetaminophen  or ibuprofen  as needed.  You also reported persistent white vaginal discharge despite recent treatment. Because of your symptoms and recent sexual activity, testing was collected today for common infections, and treatment was started for possible bacterial vaginosis and yeast infection. Take the medications exactly as prescribed and avoid alcohol while taking metronidazole . Test results will be available in MyChart, and you will be contacted if any changes to treatment are needed. Avoid sexual activity until treatment is completed and symptoms have resolved.  Follow up with your primary care provider or OB-GYN if symptoms do not improve after completing treatment or if they return. Go to the emergency department right away if you develop trouble breathing or swallowing, worsening throat swelling, high fever, severe pelvic or abdominal pain, heavy vaginal bleeding, vomiting, or any other new or concerning symptoms.

## 2024-11-02 NOTE — ED Triage Notes (Signed)
 Pt states noticed white patches with blood on rt tonsil this am. States her throat is now irritated from messing with it.   Pt states was given Diflucan  3 wks ago for yeast infection with no relief. States on sx is white chunky vaginal d/c.

## 2024-11-03 LAB — CYTOLOGY, (ORAL, ANAL, URETHRAL) ANCILLARY ONLY
Chlamydia: NEGATIVE
Comment: NEGATIVE
Comment: NORMAL
Neisseria Gonorrhea: NEGATIVE

## 2024-11-03 LAB — CERVICOVAGINAL ANCILLARY ONLY
Bacterial Vaginitis (gardnerella): NEGATIVE
Candida Glabrata: NEGATIVE
Candida Vaginitis: NEGATIVE
Chlamydia: NEGATIVE
Comment: NEGATIVE
Comment: NEGATIVE
Comment: NEGATIVE
Comment: NEGATIVE
Comment: NEGATIVE
Comment: NORMAL
Neisseria Gonorrhea: NEGATIVE
Trichomonas: NEGATIVE

## 2024-11-05 ENCOUNTER — Ambulatory Visit (HOSPITAL_COMMUNITY): Payer: Self-pay

## 2024-11-05 LAB — CULTURE, GROUP A STREP (THRC)

## 2024-11-14 ENCOUNTER — Encounter: Payer: Self-pay | Admitting: Family Medicine

## 2025-08-23 ENCOUNTER — Ambulatory Visit: Admitting: Family Medicine
